# Patient Record
Sex: Female | Born: 1965 | Race: White | Hispanic: No | Marital: Married | Smoking: Never smoker
Health system: Southern US, Community
[De-identification: ages and names within clinical notes are randomized; demographics above are authoritative.]

## PROBLEM LIST (undated history)

## (undated) DIAGNOSIS — S0300XA Dislocation of jaw, unspecified side, initial encounter: Secondary | ICD-10-CM

## (undated) DIAGNOSIS — J309 Allergic rhinitis, unspecified: Secondary | ICD-10-CM

## (undated) DIAGNOSIS — E559 Vitamin D deficiency, unspecified: Secondary | ICD-10-CM

## (undated) DIAGNOSIS — I10 Essential (primary) hypertension: Secondary | ICD-10-CM

## (undated) HISTORY — DX: Essential (primary) hypertension: I10

## (undated) HISTORY — PX: NO PAST SURGERIES: SHX2092

## (undated) HISTORY — DX: Vitamin D deficiency, unspecified: E55.9

## (undated) HISTORY — PX: COLONOSCOPY: SHX174

## (undated) HISTORY — DX: Allergic rhinitis, unspecified: J30.9

## (undated) HISTORY — DX: Dislocation of jaw, unspecified side, initial encounter: S03.00XA

---

## 2015-01-25 ENCOUNTER — Encounter: Payer: Self-pay | Admitting: Emergency Medicine

## 2015-01-25 ENCOUNTER — Ambulatory Visit
Admission: EM | Admit: 2015-01-25 | Discharge: 2015-01-25 | Disposition: A | Discharge status: Home / Self Care | Payer: BLUE CROSS/BLUE SHIELD | Attending: Family Medicine | Admitting: Family Medicine

## 2015-01-25 DIAGNOSIS — M2669 Other specified disorders of temporomandibular joint: Secondary | ICD-10-CM

## 2015-01-25 MED ORDER — MELOXICAM 15 MG PO TABS: 15.0000 mg | ORAL_TABLET | Freq: Every day | ORAL | Status: DC

## 2015-01-25 NOTE — Discharge Instructions (Signed)

## 2015-01-25 NOTE — ED Provider Notes (Signed)
CSN: [id number]     Arrival date & time 01/25/15  0807 History   First MD Initiated Contact with Patient 01/25/15 0830     Chief Complaint  Patient presents with  . Otalgia   (Consider location/radiation/quality/duration/timing/severity/associated sxs/prior Treatment) Patient is a 49 y.o. female presenting with ear pain. The history is provided by the patient.  Otalgia Location:  Right Behind ear:  No abnormality Quality:  Aching and sharp Severity:  Moderate Duration:  7 days Timing:  Constant Progression:  Worsening Chronicity:  New Context: not direct blow, not elevation change, not foreign body in ear and not loud noise   Relieved by:  Nothing Ineffective treatments:  OTC medications Associated symptoms: no congestion, no ear discharge, no fever, no hearing loss, no rhinorrhea and no sore throat   Risk factors: no recent travel and no chronic ear infection     History reviewed. No pertinent past medical history. History reviewed. No pertinent past surgical history. History reviewed. No pertinent family history. Social History  Substance Use Topics  . Smoking status: Never Smoker   . Smokeless tobacco: None  . Alcohol Use: Yes   OB History    No data available     Review of Systems  Constitutional: Negative for fever.  HENT: Positive for ear pain. Negative for congestion, ear discharge, facial swelling, hearing loss, rhinorrhea and sore throat.   All other systems reviewed and are negative.   Allergies  Review of patient's allergies indicates no known allergies.  Home Medications   Prior to Admission medications   Medication Sig Start Date End Date Taking? Authorizing Provider  labetalol (NORMODYNE) 100 MG tablet Take 100 mg by mouth 2 (two) times daily.   Yes Historical Provider, MD  loratadine (CLARITIN) 10 MG tablet Take 10 mg by mouth daily.   Yes Historical Provider, MD  meloxicam (MOBIC) 15 MG tablet Take 1 tablet (15 mg total) by mouth daily. 01/25/15    Frederich Cha, MD   Meds Ordered and Administered this Visit  Medications - No data to display  BP 120/73 mmHg  Pulse 74  Temp(Src) 97.6 F (36.4 C) (Tympanic)  Resp 20  Ht 4\' 11"  (1.499 m)  Wt 121 lb (54.885 kg)  BMI 24.43 kg/m2  SpO2 99%  LMP 11/28/2014 No data found.   Physical Exam  Constitutional: She is oriented to person, place, and time. She appears well-developed and well-nourished.  HENT:  Head: Normocephalic and atraumatic.    Right Ear: There is tenderness. No swelling. No foreign bodies. Tympanic membrane is not injected, not scarred, not erythematous and not bulging. Tympanic membrane mobility is normal. No middle ear effusion.  Nose: Nose normal. No mucosal edema. Right sinus exhibits no maxillary sinus tenderness and no frontal sinus tenderness. Left sinus exhibits no maxillary sinus tenderness and no frontal sinus tenderness.    Mouth/Throat: She does not have dentures. Normal dentition.  Numbness over the right TMJ. No signs of oral dentition is in good state of repair.  Eyes: Pupils are equal, round, and reactive to light.  Neck: Normal range of motion. Neck supple. No thyromegaly present.  Musculoskeletal: Normal range of motion. She exhibits no edema or tenderness.  Neurological: She is alert and oriented to person, place, and time.  Skin: Skin is warm and dry.  Psychiatric: She has a normal mood and affect. Her behavior is normal.  Vitals reviewed.   ED Course  Procedures (including critical care time)  Labs Review Labs Reviewed - No data  to display  Imaging Review No results found.   Visual Acuity Review  Right Eye Distance:   Left Eye Distance:   Bilateral Distance:    Right Eye Near:   Left Eye Near:    Bilateral Near:         MDM   1. TMJ inflammation    Placed on Mobic 15 mg 1 tablet a day recommend mouth guard #12 weeks please see dentist/oral surgeon or PCP.    Frederich Cha, MD 01/25/15 [phone]

## 2015-01-25 NOTE — ED Notes (Signed)
Pt with right ear pain and jaw pain

## 2015-10-02 ENCOUNTER — Encounter: Payer: Self-pay | Admitting: Obstetrics and Gynecology

## 2015-10-09 ENCOUNTER — Encounter: Payer: Self-pay | Admitting: Obstetrics and Gynecology

## 2015-10-09 ENCOUNTER — Ambulatory Visit (INDEPENDENT_AMBULATORY_CARE_PROVIDER_SITE_OTHER): Payer: BLUE CROSS/BLUE SHIELD | Admitting: Obstetrics and Gynecology

## 2015-10-09 VITALS — BP 127/80 | HR 71 | Ht <= 58 in | Wt 119.6 lb

## 2015-10-09 DIAGNOSIS — Z1211 Encounter for screening for malignant neoplasm of colon: Secondary | ICD-10-CM

## 2015-10-09 DIAGNOSIS — Z01419 Encounter for gynecological examination (general) (routine) without abnormal findings: Secondary | ICD-10-CM

## 2015-10-09 DIAGNOSIS — I10 Essential (primary) hypertension: Secondary | ICD-10-CM | POA: Diagnosis not present

## 2015-10-09 DIAGNOSIS — N951 Menopausal and female climacteric states: Secondary | ICD-10-CM

## 2015-10-09 DIAGNOSIS — Z8262 Family history of osteoporosis: Secondary | ICD-10-CM | POA: Diagnosis not present

## 2015-10-09 DIAGNOSIS — Z1239 Encounter for other screening for malignant neoplasm of breast: Secondary | ICD-10-CM

## 2015-10-09 DIAGNOSIS — Z8639 Personal history of other endocrine, nutritional and metabolic disease: Secondary | ICD-10-CM

## 2015-10-09 MED ORDER — LABETALOL HCL 100 MG PO TABS: 100.0000 mg | ORAL_TABLET | Freq: Once | ORAL | Status: DC

## 2015-10-09 NOTE — Patient Instructions (Addendum)
1.No Pap smear is needed Until 2019 2. Mammogram is ordered 3.Continue with healthy eating and exercise 4. Calcium 1200 mg a day and vitamin D 1000 units a day is recommended for osteoporosis prevention 5.Stool guaiac cards are given for colon cancer screening 6.DEXA scan is ordered for family history of osteoporosis. 7. Return in 1 year for annual exam 8. Labetalol 100 mg a day is ordered

## 2015-10-09 NOTE — Progress Notes (Signed)
Patient ID: Debra Haynes, female   DOB: 02/09/1966, 50 y.o.   MRN: [id number] ANNUAL PREVENTATIVE CARE GYN  ENCOUNTER NOTE  Subjective:       Debra Haynes is a 50 y.o. G41P2002 female here for a routine annual gynecologic exam.  Current complaints: 1.  Hypertension-recently moved to the area and wishes to establish care and receive a refill of her Labetalol 2. History of Vitamin D deficiency- has had 2 years of low vitamin D levels and currently takes supplement but does not recall how many units 3. States there is a family history of osteoporosis on the maternal side and is concerned about developing it   4. Perimenopause- last menses was 1/17 that she can recall; having mild night sweats and hot flashes; does not wish to begin HRT at this time   Gynecologic History Patient's last menstrual period was 04/20/2015. Contraception: sponge Last Pap: 2016. Results were: normal Last mammogram: 2016. Results were: normal  Obstetric History OB History  Gravida Para Term Preterm AB SAB TAB Ectopic Multiple Living  2 2 2       2     # Outcome Date GA Lbr Len/2nd Weight Sex Delivery Anes PTL Lv  2 Term 1997   8 lb 8 oz (3.856 kg) M Vag-Spont   Y  1 Term 1994   7 lb 9.6 oz (3.447 kg) M Vag-Spont   Y      Past Medical History  Diagnosis Date  . TMJ (dislocation of temporomandibular joint)   . Hypertension   . Vitamin D deficiency   . AR (allergic rhinitis)     Past Surgical History  Procedure Laterality Date  . No past surgeries      Current Outpatient Prescriptions on File Prior to Visit  Medication Sig Dispense Refill  . labetalol (NORMODYNE) 100 MG tablet Take 100 mg by mouth once.     . loratadine (CLARITIN) 10 MG tablet Take 10 mg by mouth daily.     No current facility-administered medications on file prior to visit.    Allergies  Allergen Reactions  . Yaz [Drospirenone-Ethinyl Estradiol] Rash    Social History   Social History  . Marital Status: Married    Spouse Name:  N/A  . Number of Children: N/A  . Years of Education: N/A   Occupational History  . Not on file.   Social History Main Topics  . Smoking status: Never Smoker   . Smokeless tobacco: Not on file  . Alcohol Use: Yes     Comment: 5 week  . Drug Use: No  . Sexual Activity: Yes    Birth Control/ Protection: Sponge   Other Topics Concern  . Not on file   Social History Narrative    Family History  Problem Relation Age of Onset  . Diabetes Father   . Breast cancer Maternal Aunt   . Colon cancer Maternal Grandfather   . Ovarian cancer Neg Hx     The following portions of the patient's history were reviewed and updated as appropriate: allergies, current medications, past family history, past medical history, past social history, past surgical history and problem list.  Review of Systems ROS Review of Systems - General ROS: negative for - chills, fatigue, fever, hot flashes, night sweats, weight gain or weight loss Psychological ROS: negative for - anxiety, decreased libido, depression, mood swings, physical abuse or sexual abuse Ophthalmic ROS: negative for - blurry vision, eye pain or loss of vision ENT ROS: negative for -  headaches, hearing change, visual changes or vocal changes Allergy and Immunology ROS: negative for - hives, itchy/watery eyes or seasonal allergies Hematological and Lymphatic ROS: negative for - bleeding problems, bruising, swollen lymph nodes or weight loss Endocrine ROS: negative for - galactorrhea, hair pattern changes, hot flashes, malaise/lethargy, mood swings, palpitations, polydipsia/polyuria, skin changes, temperature intolerance or unexpected weight changes Breast ROS: negative for - new or changing breast lumps or nipple discharge Respiratory ROS: negative for - cough or shortness of breath Cardiovascular ROS: negative for - chest pain, irregular heartbeat, palpitations or shortness of breath Gastrointestinal ROS: no abdominal pain, change in bowel  habits, or black or bloody stools Genito-Urinary ROS: no dysuria, trouble voiding, or hematuria Musculoskeletal ROS: negative for - joint pain or joint stiffness Neurological ROS: negative for - bowel and bladder control changes Dermatological ROS: negative for rash and skin lesion changes   Objective:   BP 127/80 mmHg  Pulse 71  Ht 4\' 10"  (1.473 m)  Wt 119 lb 9.6 oz (54.25 kg)  BMI 25.00 kg/m2  LMP 04/20/2015 CONSTITUTIONAL: Well-developed, well-nourished female in no acute distress.  PSYCHIATRIC: Normal mood and affect. Normal behavior. Normal judgment and thought content. NEUROLGIC: Alert and oriented to person, place, and time. Normal muscle tone coordination. No cranial nerve deficit noted. HENT:  Normocephalic, atraumatic, External right and left ear normal. Oropharynx is clear and moist EYES: Conjunctivae and EOM are normal. Pupils are equal, round, and reactive to light. No scleral icterus.  NECK: Normal range of motion, supple, no masses.  Normal thyroid.  SKIN: Skin is warm and dry. No rash noted. Not diaphoretic. No erythema. No pallor. CARDIOVASCULAR: Normal heart rate noted, regular rhythm, no murmur. RESPIRATORY: Clear to auscultation bilaterally. Effort and breath sounds normal, no problems with respiration noted. BREASTS: Symmetric in size. No masses, skin changes, nipple drainage, or lymphadenopathy. ABDOMEN: Soft, normal bowel sounds, no distention noted.  No tenderness, rebound or guarding.  BLADDER: Normal PELVIC:  External Genitalia: Normal  BUS: Normal  Vagina: Normal  Cervix: Normal, midplane, mobile, nontender  Uterus: Normal  Adnexa: Normal, nonpalpable, nontender  RV: External Exam NormaI, No Rectal Masses and Normal Sphincter tone  MUSCULOSKELETAL: Normal range of motion. No tenderness.  No cyanosis, clubbing, or edema.  2+ distal pulses. LYMPHATIC: No Axillary, Supraclavicular, or Inguinal Adenopathy.    Assessment:   Annual gynecologic examination  50 y.o. Contraception: sponge Normal BMI Problem List Items Addressed This Visit    None    Visit Diagnoses    Well woman exam with routine gynecological exam    -  Primary    Relevant Orders    Glucose, random    Hemoglobin A1c    Lipid panel    TSH    Screening for breast cancer        Relevant Orders    MM DIGITAL SCREENING BILATERAL    Screening for colon cancer        Relevant Orders    Fecal occult blood, imunochemical    H/O vitamin D deficiency        Relevant Orders    VITAMIN D 25 Hydroxy (Vit-D Deficiency, Fractures)       Plan:  Pap: Pap Co Test Mammogram: Ordered Stool Guaiac Testing:  Ordered Labs: lipid vit d tsh a1c fbs Routine preventative health maintenance measures emphasized: Exercise/Diet/Weight control, Tobacco Warnings, Alcohol/Substance use risks and Stress Management 1. DEXA scan due to family history of osteoporosis  2. Lab work 3. Refill Labetalol 100mg  daily 4.  Order mammogram 5. Return to Clinic - 1 Year  Mable Fill, PA-S Darol Destine, CMA  Herold Harms, MD   I have seen, interviewed, and examined the patient in conjunction with the [facility].A. student and affirm the diagnosis and management plan. Sarika Baldini A. Jahdiel Krol, MD, FACOG   Note: This dictation was prepared with Dragon dictation along with smaller phrase technology. Any transcriptional errors that result from this process are unintentional.

## 2015-10-10 ENCOUNTER — Telehealth: Payer: Self-pay | Admitting: Obstetrics and Gynecology

## 2015-10-10 MED ORDER — FLUTICASONE PROPIONATE 50 MCG/ACT NA SUSP: 1.0000 | Freq: Every day | NASAL | Status: DC

## 2015-10-10 NOTE — Telephone Encounter (Signed)
flonase was not sent to pharmacy. The other RX was there but not he flonase

## 2015-10-10 NOTE — Telephone Encounter (Signed)
Pt aware flonase erx.

## 2016-04-29 DIAGNOSIS — R509 Fever, unspecified: Secondary | ICD-10-CM | POA: Diagnosis not present

## 2016-04-29 DIAGNOSIS — J111 Influenza due to unidentified influenza virus with other respiratory manifestations: Secondary | ICD-10-CM | POA: Diagnosis not present

## 2016-10-11 NOTE — Progress Notes (Signed)
Patient ID: Debra Haynes, female   DOB: Jun 15, 1965, 51 y.o.   MRN: [id number] ANNUAL PREVENTATIVE CARE GYN  ENCOUNTER NOTE  Subjective:       Debra Haynes is a 51 y.o. G29P2002 female here for a routine annual gynecologic exam.  Current complaints:  1. Weight gain; states she drinks too much and hasn't exercised since moving here. "used to walk dogs twice daily but now they have a yard"  2. Perimenopause; vasomotor symptoms worse last year, limited symptoms now. 3. Hypertension; controlled with Labetalol  4. Vitamin D deficiency per last OB/GYN in [place], [place] 5. Family history of Osteoporosis  Last eye exam: 2 weeks Last dentist visit: 1 month   She has had 5 colonoscopies starting at 23 due to rectal bleeding with constipation and diarrhea. Last one recommended she change to every 10 years. States she has been improving her diet, excersie the last few weeks and reduced alcohol intake.   Gynecologic History Patient's last menstrual period was 08/26/2016 (exact date). Contraception: sponge Last Pap: 2016. Results were: normal Last mammogram: 2016. Results were: normal  Obstetric History OB History  Gravida Para Term Preterm AB Living  2 2 2     2   SAB TAB Ectopic Multiple Live Births          2    # Outcome Date GA Lbr Len/2nd Weight Sex Delivery Anes PTL Lv  2 Term 1997   8 lb 8 oz (3.856 kg) M Vag-Spont   LIV  1 Term 1994   7 lb 9.6 oz (3.447 kg) M Vag-Spont   LIV      Past Medical History:  Diagnosis Date  . AR (allergic rhinitis)   . Hypertension   . TMJ (dislocation of temporomandibular joint)   . Vitamin D deficiency    Past Surgical History:  Procedure Laterality Date  . NO PAST SURGERIES     Current Outpatient Prescriptions on File Prior to Visit  Medication Sig Dispense Refill  . cholecalciferol (VITAMIN D) 1000 units tablet Take 1,000 Units by mouth daily.    Marland Kitchen loratadine (CLARITIN) 10 MG tablet Take 10 mg by mouth daily.     No current  facility-administered medications on file prior to visit.    Allergies  Allergen Reactions  . Azithromycin   . Yaz [Drospirenone-Ethinyl Estradiol] Rash    Social History   Social History  . Marital status: Married    Spouse name: N/A  . Number of children: 2  . Years of education: N/A   Occupational History  . Not on file.   Social History Main Topics  . Smoking status: Never Smoker  . Smokeless tobacco: Never Used  . Alcohol use Yes     Comment: 5 week  . Drug use: No  . Sexual activity: Yes    Birth control/ protection: Sponge   Other Topics Concern  . Not on file   Social History Narrative   Recently moved here from [place], [place] (2017)   Husband is an Pensions consultant   2 Adult Children    Family History  Problem Relation Age of Onset  . Diabetes Father   . Hypertension Father   . Breast cancer Maternal Aunt   . Colon cancer Maternal Grandfather   . Hypertension Mother   . Hyperthyroidism Mother        Ablation   . Ovarian cancer Neg Hx    The following portions of the patient's history were reviewed and updated as appropriate: allergies, current  medications, past family history, past medical history, past social history, past surgical history and problem list.  Review of Systems Review of Systems  Constitutional: Negative for chills, diaphoresis, fever, malaise/fatigue and weight loss.  HENT: Negative for ear pain and hearing loss.   Eyes: Negative for blurred vision and double vision.  Respiratory: Negative for cough, shortness of breath and wheezing.   Cardiovascular: Negative for chest pain and palpitations.  Gastrointestinal: Negative for abdominal pain, blood in stool, constipation, diarrhea, nausea and vomiting.  Genitourinary: Negative for dysuria, frequency and urgency.       Endorses occasional mild stress incontinence   Musculoskeletal: Negative.   Skin: Negative for itching and rash.  Neurological: Negative for dizziness, speech change, focal  weakness, weakness and headaches.  Psychiatric/Behavioral: Negative.    Objective:  BP 124/81   Pulse 72   Ht 4\' 10"  (1.473 m)   Wt 133 lb 8 oz (60.6 kg)   LMP 08/26/2016 (Exact Date)   BMI 27.90 kg/m  CONSTITUTIONAL: Well-developed, well-nourished female in no acute distress.  PSYCHIATRIC: Normal mood and affect. Normal behavior. Normal judgment and thought content. NEUROLGIC: Alert and oriented to person, place, and time. Normal muscle tone coordination. No cranial nerve deficit noted. HENT:  Normocephalic, atraumatic, External right and left ear normal. Oropharynx is clear and moist EYES: Conjunctivae and EOM are normal. Pupils are equal, round, and reactive to light. No scleral icterus.  NECK: Normal range of motion, supple, no masses.  Normal thyroid.  SKIN: Skin is warm and dry. No rash noted. Not diaphoretic. No erythema. No pallor. CARDIOVASCULAR: Normal heart rate noted, regular rhythm, no murmur. RESPIRATORY: Clear to auscultation bilaterally. Effort and breath sounds normal, no problems with respiration noted. BREASTS: Symmetric in size. No masses, skin changes, nipple drainage, or lymphadenopathy. ABDOMEN: Soft, normal bowel sounds, no distention noted.  No tenderness, rebound or guarding.  BLADDER: Normal PELVIC:  External Genitalia: Normal  BUS: Normal  Vagina: Normal  Cervix: Normal, midplane, mobile, nontender  Uterus: Normal  Adnexa: Normal, nonpalpable, nontender  RV: External Exam NormaI, No Rectal Masses and Normal Sphincter tone  MUSCULOSKELETAL: Normal range of motion. No tenderness.  No cyanosis, clubbing, or edema.  2+ distal pulses. LYMPHATIC: No Axillary, Supraclavicular, or Inguinal Adenopathy.  Assessment:  Annual gynecologic examination 51 y.o. Contraception: sponge Normal BMI 27 Problem List Items Addressed This Visit      Cardiovascular and Mediastinum   Chronic hypertension   Relevant Medications   labetalol (NORMODYNE) 100 MG tablet   Other  Relevant Orders   Hepatic function panel     Other   Family history of osteoporosis   H/O vitamin D deficiency   Relevant Orders   VITAMIN D 25 Hydroxy (Vit-D Deficiency, Fractures)    Other Visit Diagnoses    Well woman exam with routine gynecological exam    -  Primary   Relevant Orders   Pap IG and HPV (high risk) DNA detection   Screening for breast cancer       Relevant Orders   MM DIGITAL SCREENING BILATERAL   Screening for colon cancer          Plan:  Pap: Pap Co Test Mammogram: Ordered Stool Guaiac Testing:  Ordered Labs: lipid vit d tsh a1c fbs liver enzymes  Routine preventative health maintenance measures emphasized: Exercise/Diet/Weight control, Tobacco Warnings, Alcohol/Substance use risks and Stress Management   1. Well woman exam with routine gynecological exam; Pap IG and HPV (high risk) DNA detection 2. Screening for  breast cancer; MM DIGITAL SCREENING BILATERAL; Future 3. Screening for colon cancer; Records request from [facility] provider 4. Chronic hypertension; refill Labetalol, Hepatic function panel 5. H/O vitamin D deficiency; (VITAMIN D 25 Hydroxy (Vit-D Deficiency, Fractures) 6. Family history of osteoporosis; DEXA and  Vit D    Crystal Miller, CMA Mark Herriman, Student-PA [employer]  Herold Harms, MD    I have seen, interviewed, and examined the patient in conjunction with the [employer].A. student and affirm the diagnosis and management plan. Demetris Capell A. Lenyx Boody, MD, FACOG   Note: This dictation was prepared with Dragon dictation along with smaller phrase technology. Any transcriptional errors that result from this process are unintentional.

## 2016-10-12 ENCOUNTER — Encounter: Payer: Self-pay | Admitting: Obstetrics and Gynecology

## 2016-10-12 ENCOUNTER — Ambulatory Visit (INDEPENDENT_AMBULATORY_CARE_PROVIDER_SITE_OTHER): Payer: BLUE CROSS/BLUE SHIELD | Admitting: Obstetrics and Gynecology

## 2016-10-12 VITALS — BP 124/81 | HR 72 | Ht <= 58 in | Wt 133.5 lb

## 2016-10-12 DIAGNOSIS — Z01419 Encounter for gynecological examination (general) (routine) without abnormal findings: Secondary | ICD-10-CM | POA: Diagnosis not present

## 2016-10-12 DIAGNOSIS — Z1239 Encounter for other screening for malignant neoplasm of breast: Secondary | ICD-10-CM

## 2016-10-12 DIAGNOSIS — Z1211 Encounter for screening for malignant neoplasm of colon: Secondary | ICD-10-CM

## 2016-10-12 DIAGNOSIS — Z13820 Encounter for screening for osteoporosis: Secondary | ICD-10-CM

## 2016-10-12 DIAGNOSIS — Z8639 Personal history of other endocrine, nutritional and metabolic disease: Secondary | ICD-10-CM

## 2016-10-12 DIAGNOSIS — I10 Essential (primary) hypertension: Secondary | ICD-10-CM

## 2016-10-12 DIAGNOSIS — Z1231 Encounter for screening mammogram for malignant neoplasm of breast: Secondary | ICD-10-CM | POA: Diagnosis not present

## 2016-10-12 DIAGNOSIS — Z8262 Family history of osteoporosis: Secondary | ICD-10-CM

## 2016-10-12 MED ORDER — LABETALOL HCL 100 MG PO TABS: 100.0000 mg | ORAL_TABLET | Freq: Once | ORAL | 4 refills | Status: DC

## 2016-10-12 NOTE — Patient Instructions (Signed)
1. Pap smear is done 2. Mammogram is ordered 3. Screening labs are obtained including vitamin D level for history of low vitamin D level and hepatic profile due to alcohol use. 4. His screening for colon cancer is deferred due to recent colonoscopy 5. Recommend calcium with vitamin D supplementation 1200 mg per day/800 international units per day 6. Labetalol is refilled for 1 year 7. DEXA scan is ordered due to family history of osteoporosis as well as low vitamin D level 8. Return in 1 year   Health Maintenance for Postmenopausal Women Menopause is a normal process in which your reproductive ability comes to an end. This process happens gradually over a span of months to years, usually between the ages of 80 and 12. Menopause is complete when you have missed 12 consecutive menstrual periods. It is important to talk with your health care provider about some of the most common conditions that affect postmenopausal women, such as heart disease, cancer, and bone loss (osteoporosis). Adopting a healthy lifestyle and getting preventive care can help to promote your health and wellness. Those actions can also lower your chances of developing some of these common conditions. What should I know about menopause? During menopause, you may experience a number of symptoms, such as:  Moderate-to-severe hot flashes.  Night sweats.  Decrease in sex drive.  Mood swings.  Headaches.  Tiredness.  Irritability.  Memory problems.  Insomnia.  Choosing to treat or not to treat menopausal changes is an individual decision that you make with your health care provider. What should I know about hormone replacement therapy and supplements? Hormone therapy products are effective for treating symptoms that are associated with menopause, such as hot flashes and night sweats. Hormone replacement carries certain risks, especially as you become older. If you are thinking about using estrogen or estrogen with  progestin treatments, discuss the benefits and risks with your health care provider. What should I know about heart disease and stroke? Heart disease, heart attack, and stroke become more likely as you age. This may be due, in part, to the hormonal changes that your body experiences during menopause. These can affect how your body processes dietary fats, triglycerides, and cholesterol. Heart attack and stroke are both medical emergencies. There are many things that you can do to help prevent heart disease and stroke:  Have your blood pressure checked at least every 1-2 years. High blood pressure causes heart disease and increases the risk of stroke.  If you are 81-56 years old, ask your health care provider if you should take aspirin to prevent a heart attack or a stroke.  Do not use any tobacco products, including cigarettes, chewing tobacco, or electronic cigarettes. If you need help quitting, ask your health care provider.  It is important to eat a healthy diet and maintain a healthy weight. ? Be sure to include plenty of vegetables, fruits, low-fat dairy products, and lean protein. ? Avoid eating foods that are high in solid fats, added sugars, or salt (sodium).  Get regular exercise. This is one of the most important things that you can do for your health. ? Try to exercise for at least 150 minutes each week. The type of exercise that you do should increase your heart rate and make you sweat. This is known as moderate-intensity exercise. ? Try to do strengthening exercises at least twice each week. Do these in addition to the moderate-intensity exercise.  Know your numbers.Ask your health care provider to check your cholesterol and  your blood glucose. Continue to have your blood tested as directed by your health care provider.  What should I know about cancer screening? There are several types of cancer. Take the following steps to reduce your risk and to catch any cancer development as  early as possible. Breast Cancer  Practice breast self-awareness. ? This means understanding how your breasts normally appear and feel. ? It also means doing regular breast self-exams. Let your health care provider know about any changes, no matter how small.  If you are 84 or older, have a clinician do a breast exam (clinical breast exam or CBE) every year. Depending on your age, family history, and medical history, it may be recommended that you also have a yearly breast X-ray (mammogram).  If you have a family history of breast cancer, talk with your health care provider about genetic screening.  If you are at high risk for breast cancer, talk with your health care provider about having an MRI and a mammogram every year.  Breast cancer (BRCA) gene test is recommended for women who have family members with BRCA-related cancers. Results of the assessment will determine the need for genetic counseling and BRCA1 and for BRCA2 testing. BRCA-related cancers include these types: ? Breast. This occurs in males or females. ? Ovarian. ? Tubal. This may also be called fallopian tube cancer. ? Cancer of the abdominal or pelvic lining (peritoneal cancer). ? Prostate. ? Pancreatic.  Cervical, Uterine, and Ovarian Cancer Your health care provider may recommend that you be screened regularly for cancer of the pelvic organs. These include your ovaries, uterus, and vagina. This screening involves a pelvic exam, which includes checking for microscopic changes to the surface of your cervix (Pap test).  For women ages 21-65, health care providers may recommend a pelvic exam and a Pap test every three years. For women ages 12-65, they may recommend the Pap test and pelvic exam, combined with testing for human papilloma virus (HPV), every five years. Some types of HPV increase your risk of cervical cancer. Testing for HPV may also be done on women of any age who have unclear Pap test results.  Other health  care providers may not recommend any screening for nonpregnant women who are considered low risk for pelvic cancer and have no symptoms. Ask your health care provider if a screening pelvic exam is right for you.  If you have had past treatment for cervical cancer or a condition that could lead to cancer, you need Pap tests and screening for cancer for at least 20 years after your treatment. If Pap tests have been discontinued for you, your risk factors (such as having a new sexual partner) need to be reassessed to determine if you should start having screenings again. Some women have medical problems that increase the chance of getting cervical cancer. In these cases, your health care provider may recommend that you have screening and Pap tests more often.  If you have a family history of uterine cancer or ovarian cancer, talk with your health care provider about genetic screening.  If you have vaginal bleeding after reaching menopause, tell your health care provider.  There are currently no reliable tests available to screen for ovarian cancer.  Lung Cancer Lung cancer screening is recommended for adults 60-80 years old who are at high risk for lung cancer because of a history of smoking. A yearly low-dose CT scan of the lungs is recommended if you:  Currently smoke.  Have a history  of at least 30 pack-years of smoking and you currently smoke or have quit within the past 15 years. A pack-year is smoking an average of one pack of cigarettes per day for one year.  Yearly screening should:  Continue until it has been 15 years since you quit.  Stop if you develop a health problem that would prevent you from having lung cancer treatment.  Colorectal Cancer  This type of cancer can be detected and can often be prevented.  Routine colorectal cancer screening usually begins at age 58 and continues through age 83.  If you have risk factors for colon cancer, your health care provider may  recommend that you be screened at an earlier age.  If you have a family history of colorectal cancer, talk with your health care provider about genetic screening.  Your health care provider may also recommend using home test kits to check for hidden blood in your stool.  A small camera at the end of a tube can be used to examine your colon directly (sigmoidoscopy or colonoscopy). This is done to check for the earliest forms of colorectal cancer.  Direct examination of the colon should be repeated every 5-10 years until age 44. However, if early forms of precancerous polyps or small growths are found or if you have a family history or genetic risk for colorectal cancer, you may need to be screened more often.  Skin Cancer  Check your skin from head to toe regularly.  Monitor any moles. Be sure to tell your health care provider: ? About any new moles or changes in moles, especially if there is a change in a mole's shape or color. ? If you have a mole that is larger than the size of a pencil eraser.  If any of your family members has a history of skin cancer, especially at a young age, talk with your health care provider about genetic screening.  Always use sunscreen. Apply sunscreen liberally and repeatedly throughout the day.  Whenever you are outside, protect yourself by wearing long sleeves, pants, a wide-brimmed hat, and sunglasses.  What should I know about osteoporosis? Osteoporosis is a condition in which bone destruction happens more quickly than new bone creation. After menopause, you may be at an increased risk for osteoporosis. To help prevent osteoporosis or the bone fractures that can happen because of osteoporosis, the following is recommended:  If you are 5-58 years old, get at least 1,000 mg of calcium and at least 600 mg of vitamin D per day.  If you are older than age 45 but younger than age 73, get at least 1,200 mg of calcium and at least 600 mg of vitamin D per  day.  If you are older than age 21, get at least 1,200 mg of calcium and at least 800 mg of vitamin D per day.  Smoking and excessive alcohol intake increase the risk of osteoporosis. Eat foods that are rich in calcium and vitamin D, and do weight-bearing exercises several times each week as directed by your health care provider. What should I know about how menopause affects my mental health? Depression may occur at any age, but it is more common as you become older. Common symptoms of depression include:  Low or sad mood.  Changes in sleep patterns.  Changes in appetite or eating patterns.  Feeling an overall lack of motivation or enjoyment of activities that you previously enjoyed.  Frequent crying spells.  Talk with your health care provider  if you think that you are experiencing depression. What should I know about immunizations? It is important that you get and maintain your immunizations. These include:  Tetanus, diphtheria, and pertussis (Tdap) booster vaccine.  Influenza every year before the flu season begins.  Pneumonia vaccine.  Shingles vaccine.  Your health care provider may also recommend other immunizations. This information is not intended to replace advice given to you by your health care provider. Make sure you discuss any questions you have with your health care provider. Document Released: 05/28/2005 Document Revised: 10/24/2015 Document Reviewed: 01/07/2015 Elsevier Interactive Patient Education  2018 [employer]  

## 2016-10-14 ENCOUNTER — Other Ambulatory Visit: Payer: BLUE CROSS/BLUE SHIELD

## 2016-10-14 DIAGNOSIS — Z8639 Personal history of other endocrine, nutritional and metabolic disease: Secondary | ICD-10-CM | POA: Diagnosis not present

## 2016-10-14 DIAGNOSIS — I10 Essential (primary) hypertension: Secondary | ICD-10-CM | POA: Diagnosis not present

## 2016-10-15 LAB — PAP IG AND HPV HIGH-RISK
HPV, high-risk: NEGATIVE
PAP Smear Comment: 0

## 2016-10-15 LAB — HEPATIC FUNCTION PANEL
ALT: 21 IU/L (ref 0–32)
AST: 23 IU/L (ref 0–40)
Albumin: 4.8 g/dL (ref 3.5–5.5)
Alkaline Phosphatase: 66 IU/L (ref 39–117)
Bilirubin Total: 0.5 mg/dL (ref 0.0–1.2)
Bilirubin, Direct: 0.17 mg/dL (ref 0.00–0.40)
Total Protein: 7 g/dL (ref 6.0–8.5)

## 2016-10-15 LAB — VITAMIN D 25 HYDROXY (VIT D DEFICIENCY, FRACTURES): Vit D, 25-Hydroxy: 32.4 ng/mL (ref 30.0–100.0)

## 2016-10-26 ENCOUNTER — Ambulatory Visit
Admission: RE | Admit: 2016-10-26 | Discharge: 2016-10-26 | Disposition: A | Discharge status: Home / Self Care | Payer: BLUE CROSS/BLUE SHIELD | Source: Ambulatory Visit | Attending: Obstetrics and Gynecology | Admitting: Obstetrics and Gynecology

## 2016-10-26 DIAGNOSIS — Z1231 Encounter for screening mammogram for malignant neoplasm of breast: Secondary | ICD-10-CM | POA: Diagnosis not present

## 2016-10-26 DIAGNOSIS — M85851 Other specified disorders of bone density and structure, right thigh: Secondary | ICD-10-CM | POA: Insufficient documentation

## 2016-10-26 DIAGNOSIS — Z8262 Family history of osteoporosis: Secondary | ICD-10-CM | POA: Insufficient documentation

## 2016-10-26 DIAGNOSIS — Z13820 Encounter for screening for osteoporosis: Secondary | ICD-10-CM

## 2016-10-26 DIAGNOSIS — Z1239 Encounter for other screening for malignant neoplasm of breast: Secondary | ICD-10-CM

## 2016-10-29 ENCOUNTER — Other Ambulatory Visit: Payer: Self-pay | Admitting: Obstetrics and Gynecology

## 2016-11-01 ENCOUNTER — Inpatient Hospital Stay
Admission: RE | Admit: 2016-11-01 | Discharge: 2016-11-01 | Disposition: A | Discharge status: Home / Self Care | Payer: Self-pay | Source: Ambulatory Visit | Attending: *Deleted | Admitting: *Deleted

## 2016-11-01 ENCOUNTER — Other Ambulatory Visit: Payer: Self-pay | Admitting: *Deleted

## 2016-11-01 DIAGNOSIS — Z9289 Personal history of other medical treatment: Secondary | ICD-10-CM

## 2016-12-15 ENCOUNTER — Telehealth: Payer: Self-pay | Admitting: Obstetrics and Gynecology

## 2016-12-15 NOTE — Telephone Encounter (Signed)
Pt stated that her insurance wasn't covered because it wasn't ordered as a preventative test and that [employer] advised pt that if Dr. Keturah Barre would amend the order, the test would be covered. Please advise. Thanks TNP

## 2016-12-16 NOTE — Telephone Encounter (Signed)
lmtrc

## 2016-12-17 NOTE — Telephone Encounter (Signed)
Pt states her bone density needs to be coded as preventive for her insurance to pay. Coded as FH of osteoporosis. Spoke with Roselyn Reef at [place] she is going to let me know who to contact to change dx code. Pt aware if may take a week to get back with her.

## 2016-12-23 NOTE — Addendum Note (Signed)
Addended by: Elouise Munroe on: 12/23/2016 04:50 PM   Modules accepted: Orders

## 2016-12-28 NOTE — Telephone Encounter (Signed)
Pt aware I have submitted a new order with correct dx code. I am awaiting to hear from [place].

## 2017-01-03 ENCOUNTER — Other Ambulatory Visit: Payer: Self-pay | Admitting: Obstetrics and Gynecology

## 2017-01-11 NOTE — Telephone Encounter (Signed)
Pt states she has pd her bone density bill. She will reach out to [facility] in a few weeks to see if the new dx code was billed.

## 2017-01-14 ENCOUNTER — Encounter: Payer: Self-pay | Admitting: Obstetrics and Gynecology

## 2017-01-14 ENCOUNTER — Ambulatory Visit (INDEPENDENT_AMBULATORY_CARE_PROVIDER_SITE_OTHER): Payer: BLUE CROSS/BLUE SHIELD | Admitting: Obstetrics and Gynecology

## 2017-01-14 VITALS — BP 128/86 | HR 82 | Ht <= 58 in | Wt 134.5 lb

## 2017-01-14 DIAGNOSIS — Z23 Encounter for immunization: Secondary | ICD-10-CM

## 2017-01-14 NOTE — Patient Instructions (Signed)

## 2017-01-14 NOTE — Progress Notes (Signed)
  After obtaining consent, and per orders of Dr. Enzo Bi injection of Influenza given by Joyice Faster. Patient instructed to report any adverse reaction immediately.  I have reviewed the record and concur with patient management and plan. DEFRANCESCO, Hassell Done, MD, Cherlynn June

## 2017-05-28 ENCOUNTER — Other Ambulatory Visit: Payer: Self-pay | Admitting: Obstetrics and Gynecology

## 2017-09-18 ENCOUNTER — Other Ambulatory Visit: Payer: Self-pay | Admitting: Obstetrics and Gynecology

## 2017-10-23 ENCOUNTER — Other Ambulatory Visit: Payer: Self-pay | Admitting: Obstetrics and Gynecology

## 2017-10-24 NOTE — Progress Notes (Deleted)
Patient ID: Debra Haynes, female   DOB: 05-06-1965, 52 y.o.   MRN: [id number] ANNUAL PREVENTATIVE CARE GYN  ENCOUNTER NOTE  Subjective:       Debra Haynes is a 52 y.o. G32P2002 female here for a routine annual gynecologic exam.  Current complaints:  1.   She has had 5 colonoscopies starting at 23 due to rectal bleeding with constipation and diarrhea. Last one recommended she change to every 10 years. States she has been improving her diet, excersie the last few weeks and reduced alcohol intake.   Gynecologic History No LMP recorded. (Menstrual status: Perimenopausal). Contraception: sponge Last Pap: 10/12/2016 neg/neg . Results were: normal Last mammogram: 11/02/2016 birad 1. Results were: normal  Obstetric History OB History  Gravida Para Term Preterm AB Living  2 2 2     2   SAB TAB Ectopic Multiple Live Births          2    # Outcome Date GA Lbr Len/2nd Weight Sex Delivery Anes PTL Lv  2 Term 1997   8 lb 8 oz (3.856 kg) M Vag-Spont   LIV  1 Term 1994   7 lb 9.6 oz (3.447 kg) M Vag-Spont   LIV    Past Medical History:  Diagnosis Date  . AR (allergic rhinitis)   . Hypertension   . TMJ (dislocation of temporomandibular joint)   . Vitamin D deficiency    Past Surgical History:  Procedure Laterality Date  . NO PAST SURGERIES     Current Outpatient Medications on File Prior to Visit  Medication Sig Dispense Refill  . cholecalciferol (VITAMIN D) 1000 units tablet Take 1,000 Units by mouth daily.    . fluticasone (FLONASE) 50 MCG/ACT nasal spray SHAKE LIQUID AND USE 1 SPRAY IN EACH NOSTRIL DAILY 16 g 2  . labetalol (NORMODYNE) 100 MG tablet TAKE 1 TABLET(100 MG) BY MOUTH 1 TIME 90 tablet 0  . labetalol (NORMODYNE) 100 MG tablet TAKE 1 TABLET(100 MG) BY MOUTH 1 TIME 90 tablet 0  . loratadine (CLARITIN) 10 MG tablet Take 10 mg by mouth daily.     No current facility-administered medications on file prior to visit.    Allergies  Allergen Reactions  . Azithromycin   . Yaz  [Drospirenone-Ethinyl Estradiol] Rash    Social History   Socioeconomic History  . Marital status: Married    Spouse name: Not on file  . Number of children: 2  . Years of education: Not on file  . Highest education level: Not on file  Occupational History  . Not on file  Social Needs  . Financial resource strain: Not on file  . Food insecurity:    Worry: Not on file    Inability: Not on file  . Transportation needs:    Medical: Not on file    Non-medical: Not on file  Tobacco Use  . Smoking status: Never Smoker  . Smokeless tobacco: Never Used  Substance and Sexual Activity  . Alcohol use: Yes    Comment: 5 week  . Drug use: No  . Sexual activity: Yes    Birth control/protection: Sponge  Lifestyle  . Physical activity:    Days per week: Not on file    Minutes per session: Not on file  . Stress: Not on file  Relationships  . Social connections:    Talks on phone: Not on file    Gets together: Not on file    Attends religious service: Not on file  Active member of club or organization: Not on file    Attends meetings of clubs or organizations: Not on file    Relationship status: Not on file  . Intimate partner violence:    Fear of current or ex partner: Not on file    Emotionally abused: Not on file    Physically abused: Not on file    Forced sexual activity: Not on file  Other Topics Concern  . Not on file  Social History Narrative   Recently moved here from [place], [place] (2017)   Husband is an Pensions consultant   2 Adult Children    Family History  Problem Relation Age of Onset  . Diabetes Father   . Hypertension Father   . Breast cancer Maternal Aunt   . Colon cancer Maternal Grandfather   . Hypertension Mother   . Hyperthyroidism Mother        Ablation   . Breast cancer Cousin 50       mat cousin  . Ovarian cancer Neg Hx    The following portions of the patient's history were reviewed and updated as appropriate: allergies, current medications, past  family history, past medical history, past social history, past surgical history and problem list.  Review of Systems  Objective:  There were no vitals taken for this visit. CONSTITUTIONAL: Well-developed, well-nourished female in no acute distress.  PSYCHIATRIC: Normal mood and affect. Normal behavior. Normal judgment and thought content. NEUROLGIC: Alert and oriented to person, place, and time. Normal muscle tone coordination. No cranial nerve deficit noted. HENT:  Normocephalic, atraumatic, External right and left ear normal. Oropharynx is clear and moist EYES: Conjunctivae and EOM are normal. Pupils are equal, round, and reactive to light. No scleral icterus.  NECK: Normal range of motion, supple, no masses.  Normal thyroid.  SKIN: Skin is warm and dry. No rash noted. Not diaphoretic. No erythema. No pallor. CARDIOVASCULAR: Normal heart rate noted, regular rhythm, no murmur. RESPIRATORY: Clear to auscultation bilaterally. Effort and breath sounds normal, no problems with respiration noted. BREASTS: Symmetric in size. No masses, skin changes, nipple drainage, or lymphadenopathy. ABDOMEN: Soft, normal bowel sounds, no distention noted.  No tenderness, rebound or guarding.  BLADDER: Normal PELVIC:  External Genitalia: Normal  BUS: Normal  Vagina: Normal  Cervix: Normal, midplane, mobile, nontender  Uterus: Normal  Adnexa: Normal, nonpalpable, nontender  RV: External Exam NormaI, No Rectal Masses and Normal Sphincter tone  MUSCULOSKELETAL: Normal range of motion. No tenderness.  No cyanosis, clubbing, or edema.  2+ distal pulses. LYMPHATIC: No Axillary, Supraclavicular, or Inguinal Adenopathy.  Assessment:  Annual gynecologic examination 52 y.o. Contraception: sponge Normal BMI 27 Problem List Items Addressed This Visit    Family history of osteoporosis   H/O vitamin D deficiency   Chronic hypertension    Other Visit Diagnoses    Well woman exam with routine gynecological exam     -  Primary   Screening for breast cancer       Screening for colon cancer          Plan:  Pap:Due 2021 Mammogram: Ordered Stool Guaiac Testing:  Ordered Labs: lipid vit d tsh a1c fbs liver enzymes  Routine preventative health maintenance measures emphasized: Exercise/Diet/Weight control, Tobacco Warnings, Alcohol/Substance use risks and Stress Management    Debra Haynes, CMA    I have seen, interviewed, and examined the patient in conjunction with the [facility] student and affirm the diagnosis and management plan. Martin A. DeFrancesco, MD, [facility]n Core  Note: This dictation was prepared with Dragon dictation along with smaller phrase technology. Any transcriptional errors that result from this process are unintentional.

## 2017-10-26 ENCOUNTER — Encounter: Payer: BLUE CROSS/BLUE SHIELD | Admitting: Obstetrics and Gynecology

## 2017-11-16 NOTE — Progress Notes (Signed)
Patient ID: Debra Haynes, female   DOB: Sep 11, 1965, 52 y.o.   MRN: [id number] ANNUAL PREVENTATIVE CARE GYN  ENCOUNTER NOTE  Subjective:       Debra Haynes is a 52 y.o. G14P2002 female here for a routine annual gynecologic exam.  Current complaints:  1. NO WT LOSS DESPITE EXERCISE 4 DAYS A WEEK AND DECREASING ETOH-patient reports getting approximately 10,000 steps a day.  She is eating irregularly, 2 meals a day.  2. NIGHT SWEATS AND HOT FLASHES X 4 MONTHS WORSE-last menstrual period March 2019; previous menstrual period October 2018; symptoms are more intense over the past 6 months.  She is interested in HRT therapy.  She is not experiencing any significant vaginal dryness.  She is taking vitamin D 1000 units a day but has not been consistently taking calcium.  Patient reports having gotten a DEXA scan showing osteopenia  Gynecologic History No LMP recorded. (Menstrual status: Perimenopausal). Contraception: NONE Last Pap: 10/12/2016 neg/neg . Results were: normal Last mammogram: 11/02/2016 birad 1. Results were: normal History of multiple colonoscopies due to recurrent rectal bleeding from constipation and diarrhea; most recent recommendation is for patient to proceed with colonoscopy every 10 years. DEXA scan 2018: Osteopenia-T score -1.6 femoral neck   Obstetric History OB History  Gravida Para Term Preterm AB Living  2 2 2     2   SAB TAB Ectopic Multiple Live Births          2    # Outcome Date GA Lbr Len/2nd Weight Sex Delivery Anes PTL Lv  2 Term 1997   8 lb 8 oz (3.856 kg) M Vag-Spont   LIV  1 Term 1994   7 lb 9.6 oz (3.447 kg) M Vag-Spont   LIV    Past Medical History:  Diagnosis Date  . AR (allergic rhinitis)   . Hypertension   . TMJ (dislocation of temporomandibular joint)   . Vitamin D deficiency    Past Surgical History:  Procedure Laterality Date  . NO PAST SURGERIES     Current Outpatient Medications on File Prior to Visit  Medication Sig Dispense Refill  .  cholecalciferol (VITAMIN D) 1000 units tablet Take 1,000 Units by mouth daily.    . fluticasone (FLONASE) 50 MCG/ACT nasal spray SHAKE LIQUID AND USE 1 SPRAY IN EACH NOSTRIL DAILY 16 g 2  . labetalol (NORMODYNE) 100 MG tablet TAKE 1 TABLET(100 MG) BY MOUTH 1 TIME 90 tablet 0  . labetalol (NORMODYNE) 100 MG tablet TAKE 1 TABLET(100 MG) BY MOUTH 1 TIME 90 tablet 0  . loratadine (CLARITIN) 10 MG tablet Take 10 mg by mouth daily.     No current facility-administered medications on file prior to visit.    Allergies  Allergen Reactions  . Azithromycin   . Yaz [Drospirenone-Ethinyl Estradiol] Rash    Social History   Socioeconomic History  . Marital status: Married    Spouse name: Not on file  . Number of children: 2  . Years of education: Not on file  . Highest education level: Not on file  Occupational History  . Not on file  Social Needs  . Financial resource strain: Not on file  . Food insecurity:    Worry: Not on file    Inability: Not on file  . Transportation needs:    Medical: Not on file    Non-medical: Not on file  Tobacco Use  . Smoking status: Never Smoker  . Smokeless tobacco: Never Used  Substance and Sexual Activity  .  Alcohol use: Yes    Comment: 5 week  . Drug use: No  . Sexual activity: Yes    Birth control/protection: Sponge  Lifestyle  . Physical activity:    Days per week: Not on file    Minutes per session: Not on file  . Stress: Not on file  Relationships  . Social connections:    Talks on phone: Not on file    Gets together: Not on file    Attends religious service: Not on file    Active member of club or organization: Not on file    Attends meetings of clubs or organizations: Not on file    Relationship status: Not on file  . Intimate partner violence:    Fear of current or ex partner: Not on file    Emotionally abused: Not on file    Physically abused: Not on file    Forced sexual activity: Not on file  Other Topics Concern  . Not on file   Social History Narrative   Recently moved here from [place], [place] (2017)   Husband is an Forensic psychologist   2 Adult Children    Family History  Problem Relation Age of Onset  . Diabetes Father   . Hypertension Father   . Breast cancer Maternal Aunt   . Colon cancer Maternal Grandfather   . Hypertension Mother   . Hyperthyroidism Mother        Ablation   . Breast cancer Cousin 8       mat cousin  . Ovarian cancer Neg Hx    The following portions of the patient's history were reviewed and updated as appropriate: allergies, current medications, past family history, past medical history, past social history, past surgical history and problem list.  Review of Systems Review of Systems  Constitutional:       Heart flashes and night sweats, increasing intensity and frequency  Eyes: Negative.   Respiratory: Negative.   Cardiovascular: Negative.   Gastrointestinal: Negative.   Genitourinary: Negative.   Musculoskeletal: Negative.   Skin: Negative.   Neurological: Negative.   Endo/Heme/Allergies: Negative.   Psychiatric/Behavioral: Negative.     Objective:  BP 108/73   Pulse 77   Ht 4\' 10"  (1.473 m)   Wt 134 lb 12.8 oz (61.1 kg)   LMP 06/17/2017 (Exact Date)   BMI 28.17 kg/m  CONSTITUTIONAL: Well-developed, well-nourished female in no acute distress.  PSYCHIATRIC: Normal mood and affect. Normal behavior. Normal judgment and thought content. [place]: Alert and oriented to person, place, and time. Normal muscle tone coordination. No cranial nerve deficit noted. HENT:  Normocephalic, atraumatic, External right and left ear normal.  EYES: Conjunctivae and EOM are normal.  No scleral icterus.  NECK: Normal range of motion, supple, no masses.  Normal thyroid.  SKIN: Skin is warm and dry. No rash noted. Not diaphoretic. No erythema. No pallor. CARDIOVASCULAR: Normal heart rate noted, regular rhythm, no murmur. RESPIRATORY: Clear to auscultation bilaterally. Effort and breath sounds  normal, no problems with respiration noted. BREASTS: Symmetric in size. No masses, skin changes, nipple drainage, or lymphadenopathy. ABDOMEN: Soft, normal bowel sounds, no distention noted.  No tenderness, rebound or guarding.  BLADDER: Normal PELVIC: (Unchanged)  External Genitalia: Normal  BUS: Normal  Vagina: Normal  Cervix: Normal, midplane, mobile, nontender  Uterus: Normal  Adnexa: Normal, nonpalpable, nontender  RV: External Exam NormaI, No Rectal Masses and Normal Sphincter tone  MUSCULOSKELETAL: Normal range of motion. No tenderness.  No cyanosis, clubbing, or edema.  2+ distal pulses. LYMPHATIC: No Axillary, Supraclavicular, or Inguinal Adenopathy.  Assessment:  Annual gynecologic examination 51 y.o. Contraception: NONE Overweight 28 Problem List Items Addressed This Visit    Family history of osteoporosis   H/O vitamin D deficiency   Chronic hypertension    Other Visit Diagnoses    Well woman exam with routine gynecological exam    -  Primary   Screening for breast cancer       Screening for colon cancer        Osteopenia  Plan:  Pap:Due 2021 Mammogram: Ordered Stool Guaiac Testing:  Ordered Labs; lipid a1c fbs tsh hepatic function Routine preventative health maintenance measures emphasized: Exercise/Diet/Weight control, Tobacco Warnings, Alcohol/Substance use risks and Stress Management  Strongly encourage calcium supplementation 600 mg twice a day.  Patient may use Tums 3 times a day as a substitute.  Continue with vitamin D supplementation Present cons of HRT have been reviewed.  Patient is willing to go on a trial of bioidentical HRT. Bijuva 1/100 mg daily is prescribed Return in 6 weeks for follow-up Return in 1 year for annual exam   Joyice Faster, CMA Brayton Mars, MD  Note: This dictation was prepared with Dragon dictation along with smaller phrase technology. Any transcriptional errors that result from this process are  unintentional.

## 2017-11-30 ENCOUNTER — Encounter: Payer: Self-pay | Admitting: Obstetrics and Gynecology

## 2017-11-30 ENCOUNTER — Ambulatory Visit (INDEPENDENT_AMBULATORY_CARE_PROVIDER_SITE_OTHER): Payer: BLUE CROSS/BLUE SHIELD | Admitting: Obstetrics and Gynecology

## 2017-11-30 VITALS — BP 108/73 | HR 77 | Ht <= 58 in | Wt 134.8 lb

## 2017-11-30 DIAGNOSIS — I10 Essential (primary) hypertension: Secondary | ICD-10-CM

## 2017-11-30 DIAGNOSIS — Z780 Asymptomatic menopausal state: Secondary | ICD-10-CM | POA: Insufficient documentation

## 2017-11-30 DIAGNOSIS — Z8639 Personal history of other endocrine, nutritional and metabolic disease: Secondary | ICD-10-CM

## 2017-11-30 DIAGNOSIS — Z1239 Encounter for other screening for malignant neoplasm of breast: Secondary | ICD-10-CM

## 2017-11-30 DIAGNOSIS — N951 Menopausal and female climacteric states: Secondary | ICD-10-CM

## 2017-11-30 DIAGNOSIS — Z1231 Encounter for screening mammogram for malignant neoplasm of breast: Secondary | ICD-10-CM | POA: Diagnosis not present

## 2017-11-30 DIAGNOSIS — Z01419 Encounter for gynecological examination (general) (routine) without abnormal findings: Secondary | ICD-10-CM

## 2017-11-30 DIAGNOSIS — M85851 Other specified disorders of bone density and structure, right thigh: Secondary | ICD-10-CM

## 2017-11-30 DIAGNOSIS — Z1211 Encounter for screening for malignant neoplasm of colon: Secondary | ICD-10-CM | POA: Diagnosis not present

## 2017-11-30 DIAGNOSIS — M8580 Other specified disorders of bone density and structure, unspecified site: Secondary | ICD-10-CM | POA: Insufficient documentation

## 2017-11-30 DIAGNOSIS — Z8262 Family history of osteoporosis: Secondary | ICD-10-CM

## 2017-11-30 DIAGNOSIS — E663 Overweight: Secondary | ICD-10-CM

## 2017-11-30 MED ORDER — LABETALOL HCL 100 MG PO TABS: 100.0000 mg | ORAL_TABLET | Freq: Once | ORAL | 3 refills | Status: DC

## 2017-11-30 MED ORDER — ESTRADIOL-PROGESTERONE 1-100 MG PO CAPS: 100.0000 mg | ORAL_CAPSULE | Freq: Every day | ORAL | 6 refills | Status: DC

## 2017-11-30 NOTE — Patient Instructions (Addendum)
1.  Pap smear is not done.  Next Pap smear is due 2021. 2.  Mammogram is ordered 3.  Stool guaiac card testing is given for colon cancer screening 4.  Screening labs are ordered 5.  Continue with healthy eating and exercise with attempt at control weight loss 6.  Return in 1 year for annual exam 7.  Begin trial of by Bijuva 1 tablet daily 8.  Return in 6 weeks for follow-up  Health Maintenance, Female Adopting a healthy lifestyle and getting preventive care can go a long way to promote health and wellness. Talk with your health care provider about what schedule of regular examinations is right for you. This is a good chance for you to check in with your provider about disease prevention and staying healthy. In between checkups, there are plenty of things you can do on your own. Experts have done a lot of research about which lifestyle changes and preventive measures are most likely to keep you healthy. Ask your health care provider for more information. Weight and diet Eat a healthy diet  Be sure to include plenty of vegetables, fruits, low-fat dairy products, and lean protein.  Do not eat a lot of foods high in solid fats, added sugars, or salt.  Get regular exercise. This is one of the most important things you can do for your health. ? Most adults should exercise for at least 150 minutes each week. The exercise should increase your heart rate and make you sweat (moderate-intensity exercise). ? Most adults should also do strengthening exercises at least twice a week. This is in addition to the moderate-intensity exercise.  Maintain a healthy weight  Body mass index (BMI) is a measurement that can be used to identify possible weight problems. It estimates body fat based on height and weight. Your health care provider can help determine your BMI and help you achieve or maintain a healthy weight.  For females 11 years of age and older: ? A BMI below 18.5 is considered underweight. ? A BMI  of 18.5 to 24.9 is normal. ? A BMI of 25 to 29.9 is considered overweight. ? A BMI of 30 and above is considered obese.  Watch levels of cholesterol and blood lipids  You should start having your blood tested for lipids and cholesterol at 52 years of age, then have this test every 5 years.  You may need to have your cholesterol levels checked more often if: ? Your lipid or cholesterol levels are high. ? You are older than 52 years of age. ? You are at high risk for heart disease.  Cancer screening Lung Cancer  Lung cancer screening is recommended for adults 42-49 years old who are at high risk for lung cancer because of a history of smoking.  A yearly low-dose CT scan of the lungs is recommended for people who: ? Currently smoke. ? Have quit within the past 15 years. ? Have at least a 30-pack-year history of smoking. A pack year is smoking an average of one pack of cigarettes a day for 1 year.  Yearly screening should continue until it has been 15 years since you quit.  Yearly screening should stop if you develop a health problem that would prevent you from having lung cancer treatment.  Breast Cancer  Practice breast self-awareness. This means understanding how your breasts normally appear and feel.  It also means doing regular breast self-exams. Let your health care provider know about any changes, no matter how small.  If you are in your 20s or 30s, you should have a clinical breast exam (CBE) by a health care provider every 1-3 years as part of a regular health exam.  If you are 75 or older, have a CBE every year. Also consider having a breast X-ray (mammogram) every year.  If you have a family history of breast cancer, talk to your health care provider about genetic screening.  If you are at high risk for breast cancer, talk to your health care provider about having an MRI and a mammogram every year.  Breast cancer gene (BRCA) assessment is recommended for women who have  family members with BRCA-related cancers. BRCA-related cancers include: ? Breast. ? Ovarian. ? Tubal. ? Peritoneal cancers.  Results of the assessment will determine the need for genetic counseling and BRCA1 and BRCA2 testing.  Cervical Cancer Your health care provider may recommend that you be screened regularly for cancer of the pelvic organs (ovaries, uterus, and vagina). This screening involves a pelvic examination, including checking for microscopic changes to the surface of your cervix (Pap test). You may be encouraged to have this screening done every 3 years, beginning at age 69.  For women ages 54-65, health care providers may recommend pelvic exams and Pap testing every 3 years, or they may recommend the Pap and pelvic exam, combined with testing for human papilloma virus (HPV), every 5 years. Some types of HPV increase your risk of cervical cancer. Testing for HPV may also be done on women of any age with unclear Pap test results.  Other health care providers may not recommend any screening for nonpregnant women who are considered low risk for pelvic cancer and who do not have symptoms. Ask your health care provider if a screening pelvic exam is right for you.  If you have had past treatment for cervical cancer or a condition that could lead to cancer, you need Pap tests and screening for cancer for at least 20 years after your treatment. If Pap tests have been discontinued, your risk factors (such as having a new sexual partner) need to be reassessed to determine if screening should resume. Some women have medical problems that increase the chance of getting cervical cancer. In these cases, your health care provider may recommend more frequent screening and Pap tests.  Colorectal Cancer  This type of cancer can be detected and often prevented.  Routine colorectal cancer screening usually begins at 52 years of age and continues through 52 years of age.  Your health care provider may  recommend screening at an earlier age if you have risk factors for colon cancer.  Your health care provider may also recommend using home test kits to check for hidden blood in the stool.  A small camera at the end of a tube can be used to examine your colon directly (sigmoidoscopy or colonoscopy). This is done to check for the earliest forms of colorectal cancer.  Routine screening usually begins at age 52.  Direct examination of the colon should be repeated every 5-10 years through 52 years of age. However, you may need to be screened more often if early forms of precancerous polyps or small growths are found.  Skin Cancer  Check your skin from head to toe regularly.  Tell your health care provider about any new moles or changes in moles, especially if there is a change in a mole's shape or color.  Also tell your health care provider if you have a mole that is  larger than the size of a pencil eraser.  Always use sunscreen. Apply sunscreen liberally and repeatedly throughout the day.  Protect yourself by wearing long sleeves, pants, a wide-brimmed hat, and sunglasses whenever you are outside.  Heart disease, diabetes, and high blood pressure  High blood pressure causes heart disease and increases the risk of stroke. High blood pressure is more likely to develop in: ? People who have blood pressure in the high end of the normal range (130-139/85-89 mm Hg). ? People who are overweight or obese. ? People who are African American.  If you are 67-38 years of age, have your blood pressure checked every 3-5 years. If you are 41 years of age or older, have your blood pressure checked every year. You should have your blood pressure measured twice-once when you are at a hospital or clinic, and once when you are not at a hospital or clinic. Record the average of the two measurements. To check your blood pressure when you are not at a hospital or clinic, you can use: ? An automated blood pressure  machine at a pharmacy. ? A home blood pressure monitor.  If you are between 25 years and 30 years old, ask your health care provider if you should take aspirin to prevent strokes.  Have regular diabetes screenings. This involves taking a blood sample to check your fasting blood sugar level. ? If you are at a normal weight and have a low risk for diabetes, have this test once every three years after 52 years of age. ? If you are overweight and have a high risk for diabetes, consider being tested at a younger age or more often. Preventing infection Hepatitis B  If you have a higher risk for hepatitis B, you should be screened for this virus. You are considered at high risk for hepatitis B if: ? You were born in a country where hepatitis B is common. Ask your health care provider which countries are considered high risk. ? Your parents were born in a high-risk country, and you have not been immunized against hepatitis B (hepatitis B vaccine). ? You have HIV or AIDS. ? You use needles to inject street drugs. ? You live with someone who has hepatitis B. ? You have had sex with someone who has hepatitis B. ? You get hemodialysis treatment. ? You take certain medicines for conditions, including cancer, organ transplantation, and autoimmune conditions.  Hepatitis C  Blood testing is recommended for: ? Everyone born from [birth year] through 1965. ? Anyone with known risk factors for hepatitis C.  Sexually transmitted infections (STIs)  You should be screened for sexually transmitted infections (STIs) including gonorrhea and chlamydia if: ? You are sexually active and are younger than 52 years of age. ? You are older than 52 years of age and your health care provider tells you that you are at risk for this type of infection. ? Your sexual activity has changed since you were last screened and you are at an increased risk for chlamydia or gonorrhea. Ask your health care provider if you are at  risk.  If you do not have HIV, but are at risk, it may be recommended that you take a prescription medicine daily to prevent HIV infection. This is called pre-exposure prophylaxis (PrEP). You are considered at risk if: ? You are sexually active and do not regularly use condoms or know the HIV status of your partner(s). ? You take drugs by injection. ? You are sexually active  with a partner who has HIV.  Talk with your health care provider about whether you are at high risk of being infected with HIV. If you choose to begin PrEP, you should first be tested for HIV. You should then be tested every 3 months for as long as you are taking PrEP. Pregnancy  If you are premenopausal and you may become pregnant, ask your health care provider about preconception counseling.  If you may become pregnant, take 400 to 800 micrograms (mcg) of folic acid every day.  If you want to prevent pregnancy, talk to your health care provider about birth control (contraception). Osteoporosis and menopause  Osteoporosis is a disease in which the bones lose minerals and strength with aging. This can result in serious bone fractures. Your risk for osteoporosis can be identified using a bone density scan.  If you are 87 years of age or older, or if you are at risk for osteoporosis and fractures, ask your health care provider if you should be screened.  Ask your health care provider whether you should take a calcium or vitamin D supplement to lower your risk for osteoporosis.  Menopause may have certain physical symptoms and risks.  Hormone replacement therapy may reduce some of these symptoms and risks. Talk to your health care provider about whether hormone replacement therapy is right for you. Follow these instructions at home:  Schedule regular health, dental, and eye exams.  Stay current with your immunizations.  Do not use any tobacco products including cigarettes, chewing tobacco, or electronic  cigarettes.  If you are pregnant, do not drink alcohol.  If you are breastfeeding, limit how much and how often you drink alcohol.  Limit alcohol intake to no more than 1 drink per day for nonpregnant women. One drink equals 12 ounces of beer, 5 ounces of wine, or 1 ounces of hard liquor.  Do not use street drugs.  Do not share needles.  Ask your health care provider for help if you need support or information about quitting drugs.  Tell your health care provider if you often feel depressed.  Tell your health care provider if you have ever been abused or do not feel safe at home. This information is not intended to replace advice given to you by your health care provider. Make sure you discuss any questions you have with your health care provider. Document Released: 10/19/2010 Document Revised: 09/11/2015 Document Reviewed: 01/07/2015 Elsevier Interactive Patient Education  [employer].

## 2017-12-01 DIAGNOSIS — H5203 Hypermetropia, bilateral: Secondary | ICD-10-CM | POA: Diagnosis not present

## 2017-12-01 DIAGNOSIS — H04123 Dry eye syndrome of bilateral lacrimal glands: Secondary | ICD-10-CM | POA: Diagnosis not present

## 2017-12-01 DIAGNOSIS — H52223 Regular astigmatism, bilateral: Secondary | ICD-10-CM | POA: Diagnosis not present

## 2017-12-01 DIAGNOSIS — H524 Presbyopia: Secondary | ICD-10-CM | POA: Diagnosis not present

## 2017-12-02 ENCOUNTER — Other Ambulatory Visit: Payer: BLUE CROSS/BLUE SHIELD

## 2017-12-02 DIAGNOSIS — Z01419 Encounter for gynecological examination (general) (routine) without abnormal findings: Secondary | ICD-10-CM | POA: Diagnosis not present

## 2017-12-03 LAB — HEMOGLOBIN A1C
Est. average glucose Bld gHb Est-mCnc: 111 mg/dL
Hgb A1c MFr Bld: 5.5 % (ref 4.8–5.6)

## 2017-12-03 LAB — LIPID PANEL
Chol/HDL Ratio: 2.4 ratio (ref 0.0–4.4)
Cholesterol, Total: 233 mg/dL — ABNORMAL HIGH (ref 100–199)
HDL: 97 mg/dL (ref >39)
LDL Calculated: 122 mg/dL — ABNORMAL HIGH (ref 0–99)
Triglycerides: 72 mg/dL (ref 0–149)
VLDL Cholesterol Cal: 14 mg/dL (ref 5–40)

## 2017-12-03 LAB — HEPATIC FUNCTION PANEL
ALT: 30 IU/L (ref 0–32)
AST: 41 IU/L — ABNORMAL HIGH (ref 0–40)
Albumin: 4.7 g/dL (ref 3.5–5.5)
Alkaline Phosphatase: 73 IU/L (ref 39–117)
Bilirubin Total: 0.4 mg/dL (ref 0.0–1.2)
Bilirubin, Direct: 0.12 mg/dL (ref 0.00–0.40)
Total Protein: 7.1 g/dL (ref 6.0–8.5)

## 2017-12-03 LAB — GLUCOSE, RANDOM: Glucose: 86 mg/dL (ref 65–99)

## 2017-12-03 LAB — TSH: TSH: 1.62 u[IU]/mL (ref 0.450–4.500)

## 2017-12-05 ENCOUNTER — Other Ambulatory Visit: Payer: Self-pay

## 2017-12-05 DIAGNOSIS — E785 Hyperlipidemia, unspecified: Secondary | ICD-10-CM

## 2018-01-11 ENCOUNTER — Encounter: Payer: BLUE CROSS/BLUE SHIELD | Admitting: Obstetrics and Gynecology

## 2018-01-17 ENCOUNTER — Encounter: Payer: BLUE CROSS/BLUE SHIELD | Admitting: Obstetrics and Gynecology

## 2018-06-08 ENCOUNTER — Other Ambulatory Visit: Payer: BLUE CROSS/BLUE SHIELD

## 2018-06-08 DIAGNOSIS — E785 Hyperlipidemia, unspecified: Secondary | ICD-10-CM

## 2018-06-09 LAB — LIPID PANEL
Chol/HDL Ratio: 2.2 ratio (ref 0.0–4.4)
Cholesterol, Total: 244 mg/dL — ABNORMAL HIGH (ref 100–199)
HDL: 109 mg/dL (ref >39)
LDL Calculated: 123 mg/dL — ABNORMAL HIGH (ref 0–99)
Triglycerides: 58 mg/dL (ref 0–149)
VLDL Cholesterol Cal: 12 mg/dL (ref 5–40)

## 2018-06-14 ENCOUNTER — Other Ambulatory Visit: Payer: Self-pay | Admitting: Obstetrics and Gynecology

## 2018-06-15 ENCOUNTER — Other Ambulatory Visit: Payer: Self-pay

## 2018-06-15 MED ORDER — ESTRADIOL-PROGESTERONE 1-100 MG PO CAPS: 1.0000 | ORAL_CAPSULE | Freq: Every day | ORAL | 6 refills | Status: DC

## 2018-10-19 ENCOUNTER — Other Ambulatory Visit: Payer: Self-pay

## 2018-10-19 MED ORDER — FLUTICASONE PROPIONATE 50 MCG/ACT NA SUSP: NASAL | 2 refills | Status: AC

## 2018-11-16 ENCOUNTER — Other Ambulatory Visit: Payer: Self-pay

## 2018-11-16 MED ORDER — PREMPRO 0.625-5 MG PO TABS: 1.0000 | ORAL_TABLET | Freq: Every day | ORAL | 6 refills | Status: DC

## 2018-12-05 ENCOUNTER — Encounter: Payer: BLUE CROSS/BLUE SHIELD | Admitting: Obstetrics and Gynecology

## 2018-12-14 ENCOUNTER — Encounter: Payer: Self-pay | Admitting: Obstetrics and Gynecology

## 2018-12-14 ENCOUNTER — Ambulatory Visit (INDEPENDENT_AMBULATORY_CARE_PROVIDER_SITE_OTHER): Payer: BC Managed Care – PPO | Admitting: Obstetrics and Gynecology

## 2018-12-14 ENCOUNTER — Other Ambulatory Visit: Payer: Self-pay

## 2018-12-14 VITALS — BP 117/70 | HR 66 | Ht <= 58 in | Wt 132.8 lb

## 2018-12-14 DIAGNOSIS — Z1239 Encounter for other screening for malignant neoplasm of breast: Secondary | ICD-10-CM

## 2018-12-14 DIAGNOSIS — E785 Hyperlipidemia, unspecified: Secondary | ICD-10-CM

## 2018-12-14 DIAGNOSIS — Z131 Encounter for screening for diabetes mellitus: Secondary | ICD-10-CM

## 2018-12-14 DIAGNOSIS — Z01419 Encounter for gynecological examination (general) (routine) without abnormal findings: Secondary | ICD-10-CM

## 2018-12-14 DIAGNOSIS — Z79890 Hormone replacement therapy (postmenopausal): Secondary | ICD-10-CM

## 2018-12-14 NOTE — Patient Instructions (Addendum)
Health Maintenance, Female Adopting a healthy lifestyle and getting preventive care are important in promoting health and wellness. Ask your health care provider about:  The right schedule for you to have regular tests and exams.  Things you can do on your own to prevent diseases and keep yourself healthy. What should I know about diet, weight, and exercise? Eat a healthy diet   Eat a diet that includes plenty of vegetables, fruits, low-fat dairy products, and lean protein.  Do not eat a lot of foods that are high in solid fats, added sugars, or sodium. Maintain a healthy weight Body mass index (BMI) is used to identify weight problems. It estimates body fat based on height and weight. Your health care provider can help determine your BMI and help you achieve or maintain a healthy weight. Get regular exercise Get regular exercise. This is one of the most important things you can do for your health. Most adults should:  Exercise for at least 150 minutes each week. The exercise should increase your heart rate and make you sweat (moderate-intensity exercise).  Do strengthening exercises at least twice a week. This is in addition to the moderate-intensity exercise.  Spend less time sitting. Even light physical activity can be beneficial. Watch cholesterol and blood lipids Have your blood tested for lipids and cholesterol at 53 years of age, then have this test every 5 years. Have your cholesterol levels checked more often if:  Your lipid or cholesterol levels are high.  You are older than 53 years of age.  You are at high risk for heart disease. What should I know about cancer screening? Depending on your health history and family history, you may need to have cancer screening at various ages. This may include screening for:  Breast cancer.  Cervical cancer.  Colorectal cancer.  Skin cancer.  Lung cancer. What should I know about heart disease, diabetes, and high blood  pressure? Blood pressure and heart disease  High blood pressure causes heart disease and increases the risk of stroke. This is more likely to develop in people who have high blood pressure readings, are of African descent, or are overweight.  Have your blood pressure checked: ? Every 3-5 years if you are 18-39 years of age. ? Every year if you are 40 years old or older. Diabetes Have regular diabetes screenings. This checks your fasting blood sugar level. Have the screening done:  Once every three years after age 40 if you are at a normal weight and have a low risk for diabetes.  More often and at a younger age if you are overweight or have a high risk for diabetes. What should I know about preventing infection? Hepatitis B If you have a higher risk for hepatitis B, you should be screened for this virus. Talk with your health care provider to find out if you are at risk for hepatitis B infection. Hepatitis C Testing is recommended for:  Everyone born from 1945 through 1965.  Anyone with known risk factors for hepatitis C. Sexually transmitted infections (STIs)  Get screened for STIs, including gonorrhea and chlamydia, if: ? You are sexually active and are younger than 53 years of age. ? You are older than 53 years of age and your health care provider tells you that you are at risk for this type of infection. ? Your sexual activity has changed since you were last screened, and you are at increased risk for chlamydia or gonorrhea. Ask your health care provider if   you are at risk.  Ask your health care provider about whether you are at high risk for HIV. Your health care provider may recommend a prescription medicine to help prevent HIV infection. If you choose to take medicine to prevent HIV, you should first get tested for HIV. You should then be tested every 3 months for as long as you are taking the medicine. Pregnancy  If you are about to stop having your period (premenopausal) and  you may become pregnant, seek counseling before you get pregnant.  Take 400 to 800 micrograms (mcg) of folic acid every day if you become pregnant.  Ask for birth control (contraception) if you want to prevent pregnancy. Osteoporosis and menopause Osteoporosis is a disease in which the bones lose minerals and strength with aging. This can result in bone fractures. If you are 65 years old or older, or if you are at risk for osteoporosis and fractures, ask your health care provider if you should:  Be screened for bone loss.  Take a calcium or vitamin D supplement to lower your risk of fractures.  Be given hormone replacement therapy (HRT) to treat symptoms of menopause. Follow these instructions at home: Lifestyle  Do not use any products that contain nicotine or tobacco, such as cigarettes, e-cigarettes, and chewing tobacco. If you need help quitting, ask your health care provider.  Do not use street drugs.  Do not share needles.  Ask your health care provider for help if you need support or information about quitting drugs. Alcohol use  Do not drink alcohol if: ? Your health care provider tells you not to drink. ? You are pregnant, may be pregnant, or are planning to become pregnant.  If you drink alcohol: ? Limit how much you use to 0-1 drink a day. ? Limit intake if you are breastfeeding.  Be aware of how much alcohol is in your drink. In the [place] one drink equals one 12 oz bottle of beer (355 mL), one 5 oz glass of wine (148 mL), or one 1 oz glass of hard liquor (44 mL). General instructions  Schedule regular health, dental, and eye exams.  Stay current with your vaccines.  Tell your health care provider if: ? You often feel depressed. ? You have ever been abused or do not feel safe at home. Summary  Adopting a healthy lifestyle and getting preventive care are important in promoting health and wellness.  Follow your health care provider's instructions about healthy  diet, exercising, and getting tested or screened for diseases.  Follow your health care provider's instructions on monitoring your cholesterol and blood pressure. This information is not intended to replace advice given to you by your health care provider. Make sure you discuss any questions you have with your health care provider. Document Released: 10/19/2010 Document Revised: 03/29/2018 Document Reviewed: 03/29/2018 Elsevier Patient Education  2020 Elsevier Inc.   Breast Self-Awareness Breast self-awareness is knowing how your breasts look and feel. Doing breast self-awareness is important. It allows you to catch a breast problem early while it is still small and can be treated. All women should do breast self-awareness, including women who have had breast implants. Tell your doctor if you notice a change in your breasts. What you need:  A mirror.  A well-lit room. How to do a breast self-exam A breast self-exam is one way to learn what is normal for your breasts and to check for changes. To do a breast self-exam: Look for changes  1.   Take off all the clothes above your waist. 2. Stand in front of a mirror in a room with good lighting. 3. Put your hands on your hips. 4. Push your hands down. 5. Look at your breasts and nipples in the mirror to see if one breast or nipple looks different from the other. Check to see if: ? The shape of one breast is different. ? The size of one breast is different. ? There are wrinkles, dips, and bumps in one breast and not the other. 6. Look at each breast for changes in the skin, such as: ? Redness. ? Scaly areas. 7. Look for changes in your nipples, such as: ? Liquid around the nipples. ? Bleeding. ? Dimpling. ? Redness. ? A change in where the nipples are. Feel for changes  1. Lie on your back on the floor. 2. Feel each breast. To do this, follow these steps: ? Pick a breast to feel. ? Put the arm closest to that breast above your head.  ? Use your other arm to feel the nipple area of your breast. Feel the area with the pads of your three middle fingers by making small circles with your fingers. For the first circle, press lightly. For the second circle, press harder. For the third circle, press even harder. ? Keep making circles with your fingers at the different pressures as you move down your breast. Stop when you feel your ribs. ? Move your fingers a little toward the center of your body. ? Start making circles with your fingers again, this time going up until you reach your collarbone. ? Keep making up-and-down circles until you reach your armpit. Remember to keep using the three pressures. ? Feel the other breast in the same way. 3. Sit or stand in the tub or shower. 4. With soapy water on your skin, feel each breast the same way you did in step 2 when you were lying on the floor. Write down what you find Writing down what you find can help you remember what to tell your doctor. Write down:  What is normal for each breast.  Any changes you find in each breast, including: ? The kind of changes you find. ? Whether you have pain. ? Size and location of any lumps.  When you last had your menstrual period. General tips  Check your breasts every month.  If you are breastfeeding, the best time to check your breasts is after you feed your baby or after you use a breast pump.  If you get menstrual periods, the best time to check your breasts is 5-7 days after your menstrual period is over.  With time, you will become comfortable with the self-exam, and you will begin to know if there are changes in your breasts. Contact a doctor if you:  See a change in the shape or size of your breasts or nipples.  See a change in the skin of your breast or nipples, such as red or scaly skin.  Have fluid coming from your nipples that is not normal.  Find a lump or thick area that was not there before.  Have pain in your breasts.   Have any concerns about your breast health. Summary  Breast self-awareness includes looking for changes in your breasts, as well as feeling for changes within your breasts.  Breast self-awareness should be done in front of a mirror in a well-lit room.  You should check your breasts every month. If you get menstrual   periods, the best time to check your breasts is 5-7 days after your menstrual period is over.  Let your doctor know of any changes you see in your breasts, including changes in size, changes on the skin, pain or tenderness, or fluid from your nipples that is not normal. This information is not intended to replace advice given to you by your health care provider. Make sure you discuss any questions you have with your health care provider. Document Released: 09/22/2007 Document Revised: 11/22/2017 Document Reviewed: 11/22/2017 Elsevier Patient Education  2020 Elsevier Inc.  

## 2018-12-14 NOTE — Progress Notes (Signed)
Pt is present for annual exam. Pt stated that she was doing well no problems. Pt stated that the prempro improved her hot flashes.

## 2018-12-14 NOTE — Progress Notes (Signed)
ANNUAL PREVENTATIVE CARE GYNECOLOGY  ENCOUNTER NOTE  Subjective:       Debra Haynes is a 53 y.o. G83P2002 married female here for a routine annual gynecologic exam. She is transitioning care from Dr. Hassell Done Defrancesco who has retired. The patient is sexually active. The patient is taking hormone replacement therapy (has been using x ~ 2-3 years). Patient denies post-menopausal vaginal bleeding. The patient wears seatbelts: yes. The patient participates in regular exercise: yes. Has the patient ever been transfused or tattooed?: no. The patient reports that there is not domestic violence in her life.  Current complaints: 1.  None.     Gynecologic History No LMP recorded. (Menstrual status: Perimenopausal). Contraception: post menopausal status Last Pap: 10/12/2016. Results were: normal Last mammogram: 11/01/2016. Results were: normal Last Colonoscopy: 12/27/2014. Repeat colonoscopy in 5 years due to family history.  Last Dexa Scan: 10/26/2016. Results were: osteopenia, T score -1.6.    Obstetric History OB History  Gravida Para Term Preterm AB Living  2 2 2     2   SAB TAB Ectopic Multiple Live Births          2    # Outcome Date GA Lbr Len/2nd Weight Sex Delivery Anes PTL Lv  2 Term 1997   8 lb 8 oz (3.856 kg) M Vag-Spont   LIV  1 Term 1994   7 lb 9.6 oz (3.447 kg) M Vag-Spont   LIV    Past Medical History:  Diagnosis Date  . AR (allergic rhinitis)   . Hypertension   . TMJ (dislocation of temporomandibular joint)   . Vitamin D deficiency     Family History  Problem Relation Age of Onset  . Diabetes Father   . Hypertension Father   . Breast cancer Maternal Aunt   . Colon cancer Maternal Grandfather   . Hypertension Mother   . Hyperthyroidism Mother        Ablation   . Breast cancer Cousin 66       mat cousin  . Ovarian cancer Neg Hx     Past Surgical History:  Procedure Laterality Date  . NO PAST SURGERIES      Social History   Socioeconomic History  . Marital  status: Married    Spouse name: Not on file  . Number of children: 2  . Years of education: Not on file  . Highest education level: Not on file  Occupational History  . Not on file  Social Needs  . Financial resource strain: Not on file  . Food insecurity    Worry: Not on file    Inability: Not on file  . Transportation needs    Medical: Not on file    Non-medical: Not on file  Tobacco Use  . Smoking status: Never Smoker  . Smokeless tobacco: Never Used  Substance and Sexual Activity  . Alcohol use: Yes    Comment: 3 DAYS WEEK  . Drug use: No  . Sexual activity: Not Currently    Birth control/protection: None  Lifestyle  . Physical activity    Days per week: 4 days    Minutes per session: 60 min  . Stress: Not on file  Relationships  . Social Herbalist on phone: Not on file    Gets together: Not on file    Attends religious service: Not on file    Active member of club or organization: Not on file    Attends meetings of clubs or organizations:  Not on file    Relationship status: Not on file  . Intimate partner violence    Fear of current or ex partner: Not on file    Emotionally abused: Not on file    Physically abused: Not on file    Forced sexual activity: Not on file  Other Topics Concern  . Not on file  Social History Narrative   Recently moved here from [place], [place] (2017)   Husband is an Forensic psychologist   2 Adult Children     Current Outpatient Medications on File Prior to Visit  Medication Sig Dispense Refill  . cholecalciferol (VITAMIN D) 1000 units tablet Take 1,000 Units by mouth daily.    Marland Kitchen estrogen, conjugated,-medroxyprogesterone (PREMPRO) 0.625-5 MG tablet Take 1 tablet by mouth daily. 30 tablet 6  . fluticasone (FLONASE) 50 MCG/ACT nasal spray SHAKE LIQUID AND USE 1 SPRAY IN EACH NOSTRIL DAILY 16 g 2  . labetalol (NORMODYNE) 100 MG tablet Take 1 tablet (100 mg total) by mouth once for 1 dose. 90 tablet 3  . loratadine (CLARITIN) 10 MG  tablet Take 10 mg by mouth daily.    . Melatonin-Pyridoxine (MELATIN PO) Take by mouth.     No current facility-administered medications on file prior to visit.     Allergies  Allergen Reactions  . Azithromycin   . Yaz [Drospirenone-Ethinyl Estradiol] Rash      Review of Systems ROS Review of Systems - General ROS: negative for - chills, fatigue, fever, hot flashes, night sweats, weight gain or weight loss Psychological ROS: negative for - anxiety, decreased libido, depression, mood swings, physical abuse or sexual abuse Ophthalmic ROS: negative for - blurry vision, eye pain or loss of vision ENT ROS: negative for - headaches, hearing change, visual changes or vocal changes Allergy and Immunology ROS: negative for - hives, itchy/watery eyes or seasonal allergies Hematological and Lymphatic ROS: negative for - bleeding problems, bruising, swollen lymph nodes or weight loss Endocrine ROS: negative for - galactorrhea, hair pattern changes, hot flashes, malaise/lethargy, mood swings, palpitations, polydipsia/polyuria, skin changes, temperature intolerance or unexpected weight changes Breast ROS: negative for - new or changing breast lumps or nipple discharge Respiratory ROS: negative for - cough or shortness of breath Cardiovascular ROS: negative for - chest pain, irregular heartbeat, palpitations or shortness of breath Gastrointestinal ROS: no abdominal pain, change in bowel habits, or black or bloody stools Genito-Urinary ROS: no dysuria, trouble voiding, or hematuria Musculoskeletal ROS: negative for - joint pain or joint stiffness Neurological ROS: negative for - bowel and bladder control changes Dermatological ROS: negative for rash and skin lesion changes   Objective:   BP 117/70   Pulse 66   Ht 4\' 10"  (1.473 m)   Wt 132 lb 12.8 oz (60.2 kg)   BMI 27.76 kg/m  CONSTITUTIONAL: Well-developed, well-nourished female in no acute distress. Overweight.   PSYCHIATRIC: Normal mood  and affect. Normal behavior. Normal judgment and thought content. Oto: Alert and oriented to person, place, and time. Normal muscle tone coordination. No cranial nerve deficit noted. HENT:  Normocephalic, atraumatic, External right and left ear normal. Oropharynx is clear and moist EYES: Conjunctivae and EOM are normal. Pupils are equal, round, and reactive to light. No scleral icterus.  NECK: Normal range of motion, supple, no masses.  Normal thyroid.  SKIN: Skin is warm and dry. No rash noted. Not diaphoretic. No erythema. No pallor. CARDIOVASCULAR: Normal heart rate noted, regular rhythm, no murmur. RESPIRATORY: Clear to auscultation bilaterally. Effort and breath sounds  normal, no problems with respiration noted. BREASTS: Symmetric in size. No masses, skin changes, nipple drainage, or lymphadenopathy. ABDOMEN: Soft, normal bowel sounds, no distention noted.  No tenderness, rebound or guarding.  BLADDER: Normal PELVIC:  Bladder no bladder distension noted  Urethra: normal appearing urethra with no masses, tenderness or lesions  Vulva: normal appearing vulva with no masses, tenderness or lesions  Vagina: normal appearing vagina with normal color and discharge, no lesions  Cervix: normal appearing cervix without discharge or lesions  Uterus: uterus is normal size, shape, consistency and nontender  Adnexa: normal adnexa in size, nontender and no masses  RV: External Exam NormaI, No Rectal Masses and Normal Sphincter tone  MUSCULOSKELETAL: Normal range of motion. No tenderness.  No cyanosis, clubbing, or edema.  2+ distal pulses. LYMPHATIC: No Axillary, Supraclavicular, or Inguinal Adenopathy.   Labs: No results found for: WBC, HGB, HCT, MCV, PLT  No results found for: CREATININE, BUN, NA, K, CL, CO2  Lab Results  Component Value Date   ALT 30 12/02/2017   AST 41 (H) 12/02/2017   ALKPHOS 73 12/02/2017   BILITOT 0.4 12/02/2017    Lab Results  Component Value Date   CHOL 244  (H) 06/08/2018   HDL 109 06/08/2018   LDLCALC 123 (H) 06/08/2018   TRIG 58 06/08/2018   CHOLHDL 2.2 06/08/2018    Lab Results  Component Value Date   TSH 1.620 12/02/2017    Lab Results  Component Value Date   HGBA1C 5.5 12/02/2017     Assessment:   Well woman exam with routine gynecological exam  Screening for breast cancer Screening for diabetes mellitus Dyslipidemia Post-menopause on HRT (hormone replacement therapy) Overweight  Osteopenia  Plan:  Pap: Not needed. Currently up to date.  Mammogram: Ordered Stool Guaiac Testing:  Not Indicated. Up to date on colonoscopy. Due again in 1 year.  Labs: CBC, CMP, Lipid 1, TSH and Hemoglobin A1C Routine preventative health maintenance measures emphasized: Exercise/Diet/Weight control, Tobacco Warnings, Alcohol/Substance use risks, Stress Management and Safe Sex Menopausal syndrome. This woman has a ~3 year history of HRT exposure with new study data showing increased risk of thrombo-embolic events such as myocardial infarction stroke and breast cancer after 4 or more years exposure to combination products with estrogen and progesterone. She understands the benefits as well as the risks discussed above. She  is advised that she may wish to discontinue the HRT at any time. Her personal risk factors have been reviewed carefully with her today. Osteopenia should be helped by HRT, also encourage Calcium and Vitamin D supplementation.  Return to [place], MD [facility]

## 2018-12-15 LAB — CBC
Hematocrit: 37.9 % (ref 34.0–46.6)
Hemoglobin: 12.8 g/dL (ref 11.1–15.9)
MCH: 30.7 pg (ref 26.6–33.0)
MCHC: 33.8 g/dL (ref 31.5–35.7)
MCV: 91 fL (ref 79–97)
Platelets: 280 10*3/uL (ref 150–450)
RBC: 4.17 x10E6/uL (ref 3.77–5.28)
RDW: 12.2 % (ref 11.7–15.4)
WBC: 5.9 10*3/uL (ref 3.4–10.8)

## 2018-12-15 LAB — COMPREHENSIVE METABOLIC PANEL
ALT: 15 IU/L (ref 0–32)
AST: 17 IU/L (ref 0–40)
Albumin/Globulin Ratio: 2.5 — ABNORMAL HIGH (ref 1.2–2.2)
Albumin: 5 g/dL — ABNORMAL HIGH (ref 3.8–4.9)
Alkaline Phosphatase: 66 IU/L (ref 39–117)
BUN/Creatinine Ratio: 21 (ref 9–23)
BUN: 15 mg/dL (ref 6–24)
Bilirubin Total: 0.3 mg/dL (ref 0.0–1.2)
CO2: 23 mmol/L (ref 20–29)
Calcium: 9.9 mg/dL (ref 8.7–10.2)
Chloride: 101 mmol/L (ref 96–106)
Creatinine, Ser: 0.73 mg/dL (ref 0.57–1.00)
GFR calc Af Amer: 109 mL/min/{1.73_m2} (ref >59)
GFR calc non Af Amer: 94 mL/min/{1.73_m2} (ref >59)
Globulin, Total: 2 g/dL (ref 1.5–4.5)
Glucose: 85 mg/dL (ref 65–99)
Potassium: 4 mmol/L (ref 3.5–5.2)
Sodium: 140 mmol/L (ref 134–144)
Total Protein: 7 g/dL (ref 6.0–8.5)

## 2019-01-18 ENCOUNTER — Other Ambulatory Visit: Payer: Self-pay

## 2019-01-18 ENCOUNTER — Telehealth: Payer: Self-pay

## 2019-01-18 MED ORDER — LABETALOL HCL 100 MG PO TABS: 100.0000 mg | ORAL_TABLET | Freq: Every day | ORAL | 3 refills | Status: DC

## 2019-01-18 MED ORDER — LABETALOL HCL 100 MG PO TABS: 100.0000 mg | ORAL_TABLET | Freq: Once | ORAL | 3 refills | Status: DC

## 2019-01-18 NOTE — Telephone Encounter (Signed)
Question about the directions on labetalol 100mg .  Please call Manuela Schwartz at [phone]. [facility]

## 2019-01-25 DIAGNOSIS — L918 Other hypertrophic disorders of the skin: Secondary | ICD-10-CM | POA: Diagnosis not present

## 2019-01-25 DIAGNOSIS — Z1283 Encounter for screening for malignant neoplasm of skin: Secondary | ICD-10-CM | POA: Diagnosis not present

## 2019-01-25 DIAGNOSIS — L814 Other melanin hyperpigmentation: Secondary | ICD-10-CM | POA: Diagnosis not present

## 2019-01-25 DIAGNOSIS — L821 Other seborrheic keratosis: Secondary | ICD-10-CM | POA: Diagnosis not present

## 2019-02-07 ENCOUNTER — Ambulatory Visit
Admission: RE | Admit: 2019-02-07 | Discharge: 2019-02-07 | Disposition: A | Discharge status: Home / Self Care | Payer: BC Managed Care – PPO | Source: Ambulatory Visit | Attending: Obstetrics and Gynecology | Admitting: Obstetrics and Gynecology

## 2019-02-07 DIAGNOSIS — Z1239 Encounter for other screening for malignant neoplasm of breast: Secondary | ICD-10-CM

## 2019-02-07 DIAGNOSIS — Z1231 Encounter for screening mammogram for malignant neoplasm of breast: Secondary | ICD-10-CM | POA: Diagnosis not present

## 2019-03-28 DIAGNOSIS — H40033 Anatomical narrow angle, bilateral: Secondary | ICD-10-CM | POA: Diagnosis not present

## 2019-04-05 DIAGNOSIS — S63591A Other specified sprain of right wrist, initial encounter: Secondary | ICD-10-CM | POA: Diagnosis not present

## 2019-06-08 ENCOUNTER — Other Ambulatory Visit: Payer: Self-pay | Admitting: Obstetrics and Gynecology

## 2019-07-05 ENCOUNTER — Ambulatory Visit: Payer: BC Managed Care – PPO

## 2019-07-06 ENCOUNTER — Ambulatory Visit: Payer: BC Managed Care – PPO | Attending: Internal Medicine

## 2019-07-06 DIAGNOSIS — Z23 Encounter for immunization: Secondary | ICD-10-CM

## 2019-07-06 NOTE — Progress Notes (Signed)
   Covid-19 Vaccination Clinic  Name:  Debra Haynes    MRN: [id number] DOB: Jun 14, 1965  07/06/2019  [name] was observed post Covid-19 immunization for 15 minutes without incident. She was provided with Vaccine Information Sheet and instruction to access the V-Safe system.   [name] was instructed to call 911 with any severe reactions post vaccine: Marland Kitchen Difficulty breathing  . Swelling of face and throat  . A fast heartbeat  . A bad rash all over body  . Dizziness and weakness   Immunizations Administered    Name Date Dose VIS Date Route   Pfizer COVID-19 Vaccine 07/06/2019  3:10 PM 0.3 mL 03/30/2019 Intramuscular   Manufacturer: [place]   Lot: [id number]   [place]: [id number]

## 2019-08-01 ENCOUNTER — Ambulatory Visit: Payer: BC Managed Care – PPO | Attending: Internal Medicine

## 2019-08-01 DIAGNOSIS — Z23 Encounter for immunization: Secondary | ICD-10-CM

## 2019-08-01 NOTE — Progress Notes (Signed)
   Covid-19 Vaccination Clinic  Name:  Laketha Fu    MRN: [id number] DOB: 01-14-1966  08/01/2019  Ms. Corro was observed post Covid-19 immunization for 15 minutes without incident. She was provided with Vaccine Information Sheet and instruction to access the V-Safe system.   Ms. Cooprider was instructed to call 911 with any severe reactions post vaccine: Marland Kitchen Difficulty breathing  . Swelling of face and throat  . A fast heartbeat  . A bad rash all over body  . Dizziness and weakness   Immunizations Administered    Name Date Dose VIS Date Route   Pfizer COVID-19 Vaccine 08/01/2019  9:50 AM 0.3 mL 03/30/2019 Intramuscular   Manufacturer: [place]   Lot: [id number]   [place]: [id number]

## 2019-11-15 ENCOUNTER — Other Ambulatory Visit: Payer: Self-pay | Admitting: Obstetrics and Gynecology

## 2019-12-19 ENCOUNTER — Encounter: Payer: Self-pay | Admitting: Obstetrics and Gynecology

## 2019-12-19 ENCOUNTER — Ambulatory Visit (INDEPENDENT_AMBULATORY_CARE_PROVIDER_SITE_OTHER): Payer: BC Managed Care – PPO | Admitting: Obstetrics and Gynecology

## 2019-12-19 ENCOUNTER — Other Ambulatory Visit: Payer: Self-pay

## 2019-12-19 ENCOUNTER — Other Ambulatory Visit: Payer: Self-pay | Admitting: Obstetrics and Gynecology

## 2019-12-19 ENCOUNTER — Other Ambulatory Visit (HOSPITAL_COMMUNITY)
Admission: RE | Admit: 2019-12-19 | Discharge: 2019-12-19 | Disposition: A | Discharge status: Home / Self Care | Payer: BC Managed Care – PPO | Source: Ambulatory Visit | Attending: Obstetrics and Gynecology | Admitting: Obstetrics and Gynecology

## 2019-12-19 VITALS — BP 129/81 | HR 85 | Ht <= 58 in | Wt 135.7 lb

## 2019-12-19 DIAGNOSIS — Z01419 Encounter for gynecological examination (general) (routine) without abnormal findings: Secondary | ICD-10-CM | POA: Diagnosis not present

## 2019-12-19 DIAGNOSIS — Z124 Encounter for screening for malignant neoplasm of cervix: Secondary | ICD-10-CM | POA: Insufficient documentation

## 2019-12-19 DIAGNOSIS — Z79890 Hormone replacement therapy (postmenopausal): Secondary | ICD-10-CM

## 2019-12-19 DIAGNOSIS — Z1231 Encounter for screening mammogram for malignant neoplasm of breast: Secondary | ICD-10-CM

## 2019-12-19 DIAGNOSIS — Z800 Family history of malignant neoplasm of digestive organs: Secondary | ICD-10-CM

## 2019-12-19 DIAGNOSIS — E785 Hyperlipidemia, unspecified: Secondary | ICD-10-CM

## 2019-12-19 DIAGNOSIS — Z1211 Encounter for screening for malignant neoplasm of colon: Secondary | ICD-10-CM

## 2019-12-19 DIAGNOSIS — E663 Overweight: Secondary | ICD-10-CM

## 2019-12-19 NOTE — Progress Notes (Signed)
Pt present for annual exam. Pt stated that she was doing well no problems.  

## 2019-12-19 NOTE — Progress Notes (Addendum)
ANNUAL PREVENTATIVE CARE GYNECOLOGY  ENCOUNTER NOTE  Subjective:       Debra Haynes is a 54 y.o. G74P2002 married female here for a routine annual gynecologic exam.  The patient is taking hormone replacement therapy (has been using x ~3-4 years). Patient denies post-menopausal vaginal bleeding. The patient wears seatbelts: yes.   Current complaints: 1.  None.  Overall doing well, no complaints. Reports she is trying to adopt an overall healthier lifestyle.    Gynecologic History Patient's last menstrual period was 06/17/2017 (exact date). Contraception: post menopausal status Last Pap: 10/12/2016. Results were: normal Last mammogram: 11/01/2016. Results were: normal Last Colonoscopy: 12/27/2014 (performed in [place], [place], records reviewed, normal). Repeat colonoscopy in 5 years due to family history.  Last Dexa Scan: 10/26/2016. Results were: osteopenia, T score -1.6.    Obstetric History OB History  Gravida Para Term Preterm AB Living  2 2 2     2   SAB TAB Ectopic Multiple Live Births          2    # Outcome Date GA Lbr Len/2nd Weight Sex Delivery Anes PTL Lv  2 Term 1997   8 lb 8 oz (3.856 kg) M Vag-Spont   LIV  1 Term 1994   7 lb 9.6 oz (3.447 kg) M Vag-Spont   LIV    Past Medical History:  Diagnosis Date  . AR (allergic rhinitis)   . Hypertension   . TMJ (dislocation of temporomandibular joint)   . Vitamin D deficiency     Family History  Problem Relation Age of Onset  . Diabetes Father   . Hypertension Father   . Breast cancer Maternal Aunt   . Colon cancer Maternal Grandfather   . Hypertension Mother   . Hyperthyroidism Mother        Ablation   . Breast cancer Cousin 6       mat cousin  . Ovarian cancer Neg Hx     Past Surgical History:  Procedure Laterality Date  . NO PAST SURGERIES      Social History   Socioeconomic History  . Marital status: Married    Spouse name: Not on file  . Number of children: 2  . Years of education: Not on file  .  Highest education level: Not on file  Occupational History  . Not on file  Tobacco Use  . Smoking status: Never Smoker  . Smokeless tobacco: Never Used  Vaping Use  . Vaping Use: Never used  Substance and Sexual Activity  . Alcohol use: Yes    Comment: 3 DAYS WEEK  . Drug use: No  . Sexual activity: Not Currently    Birth control/protection: None  Other Topics Concern  . Not on file  Social History Narrative   Recently moved here from [place], [place] (2017)   Husband is an Forensic psychologist   2 Adult Children    Social Determinants of Health   Financial Resource Strain:   . Difficulty of Paying Living Expenses: Not on file  Food Insecurity:   . Worried About Charity fundraiser in the Last Year: Not on file  . Ran Out of Food in the Last Year: Not on file  Transportation Needs:   . Lack of Transportation (Medical): Not on file  . Lack of Transportation (Non-Medical): Not on file  Physical Activity:   . Days of Exercise per Week: Not on file  . Minutes of Exercise per Session: Not on file  Stress:   .  Feeling of Stress : Not on file  Social Connections:   . Frequency of Communication with Friends and Family: Not on file  . Frequency of Social Gatherings with Friends and Family: Not on file  . Attends Religious Services: Not on file  . Active Member of Clubs or Organizations: Not on file  . Attends Archivist Meetings: Not on file  . Marital Status: Not on file  Intimate Partner Violence:   . Fear of Current or Ex-Partner: Not on file  . Emotionally Abused: Not on file  . Physically Abused: Not on file  . Sexually Abused: Not on file    Current Outpatient Medications on File Prior to Visit  Medication Sig Dispense Refill  . fluticasone (FLONASE) 50 MCG/ACT nasal spray SHAKE LIQUID AND USE 1 SPRAY IN EACH NOSTRIL DAILY 16 g 2  . labetalol (NORMODYNE) 100 MG tablet TAKE 1 TABLET(100 MG) BY MOUTH DAILY 90 tablet 0  . loratadine (CLARITIN) 10 MG tablet Take 10 mg by  mouth daily.    . Melatonin-Pyridoxine (MELATIN PO) Take by mouth.    . Multiple Vitamins-Minerals (MULTIVITAMIN GUMMIES WOMENS PO) Take by mouth.    Marland Kitchen PREMPRO 0.625-5 MG tablet TAKE 1 TABLET BY MOUTH DAILY 90 tablet 2   No current facility-administered medications on file prior to visit.    Allergies  Allergen Reactions  . Azithromycin   . Yaz [Drospirenone-Ethinyl Estradiol] Rash      Review of Systems ROS Review of Systems - General ROS: negative for - chills, fatigue, fever, hot flashes, night sweats, weight gain or weight loss Psychological ROS: negative for - anxiety, decreased libido, depression, mood swings, physical abuse or sexual abuse Ophthalmic ROS: negative for - blurry vision, eye pain or loss of vision ENT ROS: negative for - headaches, hearing change, visual changes or vocal changes Allergy and Immunology ROS: negative for - hives, itchy/watery eyes or seasonal allergies Hematological and Lymphatic ROS: negative for - bleeding problems, bruising, swollen lymph nodes or weight loss Endocrine ROS: negative for - galactorrhea, hair pattern changes, hot flashes, malaise/lethargy, mood swings, palpitations, polydipsia/polyuria, skin changes, temperature intolerance or unexpected weight changes Breast ROS: negative for - new or changing breast lumps or nipple discharge Respiratory ROS: negative for - cough or shortness of breath Cardiovascular ROS: negative for - chest pain, irregular heartbeat, palpitations or shortness of breath Gastrointestinal ROS: no abdominal pain, change in bowel habits, or black or bloody stools Genito-Urinary ROS: no dysuria, trouble voiding, or hematuria Musculoskeletal ROS: negative for - joint pain or joint stiffness Neurological ROS: negative for - bowel and bladder control changes Dermatological ROS: negative for rash and skin lesion changes   Objective:   BP 129/81   Pulse 85   Ht 4\' 10"  (1.473 m)   Wt 135 lb 11.2 oz (61.6 kg)   BMI  28.36 kg/m  CONSTITUTIONAL: Well-developed, well-nourished female in no acute distress. Overweight.   PSYCHIATRIC: Normal mood and affect. Normal behavior. Normal judgment and thought content. [place]: Alert and oriented to person, place, and time. Normal muscle tone coordination. No cranial nerve deficit noted. HENT:  Normocephalic, atraumatic, External right and left ear normal. Oropharynx is clear and moist EYES: Conjunctivae and EOM are normal. Pupils are equal, round, and reactive to light. No scleral icterus.  NECK: Normal range of motion, supple, no masses.  Normal thyroid.  SKIN: Skin is warm and dry. No rash noted. Not diaphoretic. No erythema. No pallor. CARDIOVASCULAR: Normal heart rate noted, regular rhythm, no  murmur. RESPIRATORY: Clear to auscultation bilaterally. Effort and breath sounds normal, no problems with respiration noted. BREASTS: Symmetric in size. No masses, skin changes, nipple drainage, or lymphadenopathy. ABDOMEN: Soft, normal bowel sounds, no distention noted.  No tenderness, rebound or guarding.  BLADDER: Normal PELVIC:  Bladder no bladder distension noted  Urethra: normal appearing urethra with no masses, tenderness or lesions  Vulva: normal appearing vulva with no masses, tenderness or lesions  Vagina: normal appearing vagina with normal color and discharge, no lesions  Cervix: normal appearing cervix without discharge or lesions  Uterus: uterus is normal size, shape, consistency and nontender  Adnexa: normal adnexa in size, nontender and no masses  RV: External Exam NormaI, No Rectal Masses and Normal Sphincter tone  MUSCULOSKELETAL: Normal range of motion. No tenderness.  No cyanosis, clubbing, or edema.  2+ distal pulses. LYMPHATIC: No Axillary, Supraclavicular, or Inguinal Adenopathy.   Labs: Lab Results  Component Value Date   WBC 5.9 12/14/2018   HGB 12.8 12/14/2018   HCT 37.9 12/14/2018   MCV 91 12/14/2018   PLT 280 12/14/2018    Lab  Results  Component Value Date   CREATININE 0.73 12/14/2018   BUN 15 12/14/2018   NA 140 12/14/2018   K 4.0 12/14/2018   CL 101 12/14/2018   CO2 23 12/14/2018    Lab Results  Component Value Date   ALT 15 12/14/2018   AST 17 12/14/2018   ALKPHOS 66 12/14/2018   BILITOT 0.3 12/14/2018    Lab Results  Component Value Date   CHOL 244 (H) 06/08/2018   HDL 109 06/08/2018   LDLCALC 123 (H) 06/08/2018   TRIG 58 06/08/2018   CHOLHDL 2.2 06/08/2018    Lab Results  Component Value Date   TSH 1.620 12/02/2017    Lab Results  Component Value Date   HGBA1C 5.5 12/02/2017     Assessment:   Well woman exam with routine gynecological exam  Dyslipidemia Post-menopause on HRT (hormone replacement therapy) Overweight  Osteopenia Family history of colon cancer  Plan:  Pap: Pap Co Test. Continue routine screening Mammogram: Ordered Stool Guaiac Testing:  Not Indicated. Up to date on colonoscopy. Due for repeat this year (per GI recommendations for family h/o colon cancer).   Labs: see orders Routine preventative health maintenance measures emphasized: Exercise/Diet/Weight control, Tobacco Warnings, Alcohol/Substance use risks, Stress Management and Safe Sex Menopausal syndrome.Naoko has a ~4 year history of HRT exposure.  Recommendation are for lowest dose for shortest period of time to manage symptoms. Osteopenia should be helped by HRT. Can refill when due.  Osteopenia - endourage Calcium and Vitamin D supplementation. Also may be helped by hormonal therapy. Can consider repeat in 1 year. COVID-19 vaccination series: Has completed vaccination series.   Return to [place], MD [facility]

## 2019-12-19 NOTE — Patient Instructions (Signed)
Preventive Care 40-54 Years Old, Female Preventive care refers to visits with your health care provider and lifestyle choices that can promote health and wellness. This includes:  A yearly physical exam. This may also be called an annual well check.  Regular dental visits and eye exams.  Immunizations.  Screening for certain conditions.  Healthy lifestyle choices, such as eating a healthy diet, getting regular exercise, not using drugs or products that contain nicotine and tobacco, and limiting alcohol use. What can I expect for my preventive care visit? Physical exam Your health care provider will check your:  Height and weight. This may be used to calculate body mass index (BMI), which tells if you are at a healthy weight.  Heart rate and blood pressure.  Skin for abnormal spots. Counseling Your health care provider may ask you questions about your:  Alcohol, tobacco, and drug use.  Emotional well-being.  Home and relationship well-being.  Sexual activity.  Eating habits.  Work and work environment.  Method of birth control.  Menstrual cycle.  Pregnancy history. What immunizations do I need?  Influenza (flu) vaccine  This is recommended every year. Tetanus, diphtheria, and pertussis (Tdap) vaccine  You may need a Td booster every 10 years. Varicella (chickenpox) vaccine  You may need this if you have not been vaccinated. Zoster (shingles) vaccine  You may need this after age 60. Measles, mumps, and rubella (MMR) vaccine  You may need at least one dose of MMR if you were born in 1957 or later. You may also need a second dose. Pneumococcal conjugate (PCV13) vaccine  You may need this if you have certain conditions and were not previously vaccinated. Pneumococcal polysaccharide (PPSV23) vaccine  You may need one or two doses if you smoke cigarettes or if you have certain conditions. Meningococcal conjugate (MenACWY) vaccine  You may need this if you  have certain conditions. Hepatitis A vaccine  You may need this if you have certain conditions or if you travel or work in places where you may be exposed to hepatitis A. Hepatitis B vaccine  You may need this if you have certain conditions or if you travel or work in places where you may be exposed to hepatitis B. Haemophilus influenzae type b (Hib) vaccine  You may need this if you have certain conditions. Human papillomavirus (HPV) vaccine  If recommended by your health care provider, you may need three doses over 6 months. You may receive vaccines as individual doses or as more than one vaccine together in one shot (combination vaccines). Talk with your health care provider about the risks and benefits of combination vaccines. What tests do I need? Blood tests  Lipid and cholesterol levels. These may be checked every 5 years, or more frequently if you are over 54 years old.  Hepatitis C test.  Hepatitis B test. Screening  Lung cancer screening. You may have this screening every year starting at age 54 if you have a 30-pack-year history of smoking and currently smoke or have quit within the past 15 years.  Colorectal cancer screening. All adults should have this screening starting at age 54 and continuing until age 75. Your health care provider may recommend screening at age 45 if you are at increased risk. You will have tests every 1-10 years, depending on your results and the type of screening test.  Diabetes screening. This is done by checking your blood sugar (glucose) after you have not eaten for a while (fasting). You may have this   done every 1-3 years.  Mammogram. This may be done every 1-2 years. Talk with your health care provider about when you should start having regular mammograms. This may depend on whether you have a family history of breast cancer.  BRCA-related cancer screening. This may be done if you have a family history of breast, ovarian, tubal, or peritoneal  cancers.  Pelvic exam and Pap test. This may be done every 3 years starting at age 54. Starting at age 54, this may be done every 5 years if you have a Pap test in combination with an HPV test. if you have a Pap test in combination with an HPV test. Other tests  Sexually transmitted disease (STD) testing.  Bone density scan. This is done to screen for osteoporosis. You may have this scan if you are at high risk for osteoporosis. Follow these instructions at home: Eating and drinking  Eat a diet that includes fresh fruits and vegetables, whole grains, lean protein, and low-fat dairy.  Take vitamin and mineral supplements as recommended by your health care provider.  Do not drink alcohol if: ? Your health care provider tells you not to drink. ? You are pregnant, may be pregnant, or are planning to become pregnant.  If you drink alcohol: ? Limit how much you have to 0-1 drink a day. ? Be aware of how much alcohol is in your drink. In the [place] one drink equals one 12 oz bottle of beer (355 mL), one 5 oz glass of wine (148 mL), or one 1 oz glass of hard liquor (44 mL). Lifestyle  Take daily care of your teeth and gums.  Stay active. Exercise for at least 30 minutes on 5 or more days each week.  Do not use any products that contain nicotine or tobacco, such as cigarettes, e-cigarettes, and chewing tobacco. If you need help quitting, ask your health care provider.  If you are sexually active, practice safe sex. Use a condom or other form of birth control (contraception) in order to prevent pregnancy and STIs (sexually transmitted infections).  If told by your health care provider, take low-dose aspirin daily starting at age 54. What's next?  Visit your health care provider once a year for a well check visit.  Ask your health care provider how often you should have your eyes and teeth checked.  Stay up to date on all vaccines. This information is not intended to replace advice given to you by your health care provider. Make sure you  discuss any questions you have with your health care provider. Document Revised: 12/15/2017 Document Reviewed: 12/15/2017 Elsevier Patient Education  2020 [place] Breast self-awareness is knowing how your breasts look and feel. Doing breast self-awareness is important. It allows you to catch a breast problem early while it is still small and can be treated. All women should do breast self-awareness, including women who have had breast implants. Tell your doctor if you notice a change in your breasts. What you need:  A mirror.  A well-lit room. How to do a breast self-exam A breast self-exam is one way to learn what is normal for your breasts and to check for changes. To do a breast self-exam: Look for changes  1. Take off all the clothes above your waist. 2. Stand in front of a mirror in a room with good lighting. 3. Put your hands on your hips. 4. Push your hands down. 5. Look at your breasts and nipples in the mirror to see if one breast or nipple looks different from the  other. Check to see if: ? The shape of one breast is different. ? The size of one breast is different. ? There are wrinkles, dips, and bumps in one breast and not the other. 6. Look at each breast for changes in the skin, such as: ? Redness. ? Scaly areas. 7. Look for changes in your nipples, such as: ? Liquid around the nipples. ? Bleeding. ? Dimpling. ? Redness. ? A change in where the nipples are. Feel for changes  1. Lie on your back on the floor. 2. Feel each breast. To do this, follow these steps: ? Pick a breast to feel. ? Put the arm closest to that breast above your head. ? Use your other arm to feel the nipple area of your breast. Feel the area with the pads of your three middle fingers by making small circles with your fingers. For the first circle, press lightly. For the second circle, press harder. For the third circle, press even harder. ? Keep making circles with  your fingers at the different pressures as you move down your breast. Stop when you feel your ribs. ? Move your fingers a little toward the center of your body. ? Start making circles with your fingers again, this time going up until you reach your collarbone. ? Keep making up-and-down circles until you reach your armpit. Remember to keep using the three pressures. ? Feel the other breast in the same way. 3. Sit or stand in the tub or shower. 4. With soapy water on your skin, feel each breast the same way you did in step 2 when you were lying on the floor. Write down what you find Writing down what you find can help you remember what to tell your doctor. Write down:  What is normal for each breast.  Any changes you find in each breast, including: ? The kind of changes you find. ? Whether you have pain. ? Size and location of any lumps.  When you last had your menstrual period. General tips  Check your breasts every month.  If you are breastfeeding, the best time to check your breasts is after you feed your baby or after you use a breast pump.  If you get menstrual periods, the best time to check your breasts is 5-7 days after your menstrual period is over.  With time, you will become comfortable with the self-exam, and you will begin to know if there are changes in your breasts. Contact a doctor if you:  See a change in the shape or size of your breasts or nipples.  See a change in the skin of your breast or nipples, such as red or scaly skin.  Have fluid coming from your nipples that is not normal.  Find a lump or thick area that was not there before.  Have pain in your breasts.  Have any concerns about your breast health. Summary  Breast self-awareness includes looking for changes in your breasts, as well as feeling for changes within your breasts.  Breast self-awareness should be done in front of a mirror in a well-lit room.  You should check your breasts every month.  If you get menstrual periods, the best time to check your breasts is 5-7 days after your menstrual period is over.  Let your doctor know of any changes you see in your breasts, including changes in size, changes on the skin, pain or tenderness, or fluid from your nipples that is not normal. This information is not  intended to replace advice given to you by your health care provider. Make sure you discuss any questions you have with your health care provider. Document Revised: 11/22/2017 Document Reviewed: 11/22/2017 Elsevier Patient Education  [place].

## 2019-12-20 LAB — COMPREHENSIVE METABOLIC PANEL
ALT: 21 IU/L (ref 0–32)
AST: 20 IU/L (ref 0–40)
Albumin/Globulin Ratio: 2 (ref 1.2–2.2)
Albumin: 4.7 g/dL (ref 3.8–4.9)
Alkaline Phosphatase: 66 IU/L (ref 48–121)
BUN/Creatinine Ratio: 15 (ref 9–23)
BUN: 9 mg/dL (ref 6–24)
Bilirubin Total: 0.3 mg/dL (ref 0.0–1.2)
CO2: 23 mmol/L (ref 20–29)
Calcium: 9.3 mg/dL (ref 8.7–10.2)
Chloride: 103 mmol/L (ref 96–106)
Creatinine, Ser: 0.6 mg/dL (ref 0.57–1.00)
GFR calc Af Amer: 120 mL/min/{1.73_m2} (ref >59)
GFR calc non Af Amer: 104 mL/min/{1.73_m2} (ref >59)
Globulin, Total: 2.3 g/dL (ref 1.5–4.5)
Glucose: 76 mg/dL (ref 65–99)
Potassium: 4.3 mmol/L (ref 3.5–5.2)
Sodium: 139 mmol/L (ref 134–144)
Total Protein: 7 g/dL (ref 6.0–8.5)

## 2019-12-20 LAB — TSH: TSH: 1.71 u[IU]/mL (ref 0.450–4.500)

## 2019-12-20 LAB — CBC
Hematocrit: 37.7 % (ref 34.0–46.6)
Hemoglobin: 12.9 g/dL (ref 11.1–15.9)
MCH: 30.4 pg (ref 26.6–33.0)
MCHC: 34.2 g/dL (ref 31.5–35.7)
MCV: 89 fL (ref 79–97)
Platelets: 295 10*3/uL (ref 150–450)
RBC: 4.25 x10E6/uL (ref 3.77–5.28)
RDW: 12 % (ref 11.7–15.4)
WBC: 4.7 10*3/uL (ref 3.4–10.8)

## 2019-12-20 LAB — LIPID PANEL
Chol/HDL Ratio: 2.3 ratio (ref 0.0–4.4)
Cholesterol, Total: 212 mg/dL — ABNORMAL HIGH (ref 100–199)
HDL: 94 mg/dL (ref >39)
LDL Chol Calc (NIH): 97 mg/dL (ref 0–99)
Triglycerides: 126 mg/dL (ref 0–149)
VLDL Cholesterol Cal: 21 mg/dL (ref 5–40)

## 2019-12-21 LAB — CYTOLOGY - PAP
Comment: NEGATIVE
Diagnosis: NEGATIVE
High risk HPV: NEGATIVE

## 2019-12-23 ENCOUNTER — Encounter: Payer: Self-pay | Admitting: Obstetrics and Gynecology

## 2019-12-26 ENCOUNTER — Other Ambulatory Visit: Payer: Self-pay

## 2019-12-26 ENCOUNTER — Telehealth (INDEPENDENT_AMBULATORY_CARE_PROVIDER_SITE_OTHER): Payer: Self-pay | Admitting: Gastroenterology

## 2019-12-26 DIAGNOSIS — Z8371 Family history of colonic polyps: Secondary | ICD-10-CM

## 2019-12-26 DIAGNOSIS — Z1211 Encounter for screening for malignant neoplasm of colon: Secondary | ICD-10-CM

## 2019-12-26 MED ORDER — PEG 3350-KCL-NA BICARB-NACL 420 G PO SOLR: 4000.0000 mL | Freq: Once | ORAL | 0 refills | Status: AC

## 2019-12-26 NOTE — Progress Notes (Signed)
Gastroenterology Pre-Procedure Review  Request Date: Tuesday 01/01/20 Requesting Physician: Dr. Allen Norris  PATIENT REVIEW QUESTIONS: The patient responded to the following health history questions as indicated:    1. Are you having any GI issues? no 2. Do you have a personal history of Polyps? no 3. Do you have a family history of Colon Cancer or Polyps? yes (maternal grandfather colon cancer, mother had colon polyps) 4. Diabetes Mellitus? no 5. Joint replacements in the past 12 months?no 6. Major health problems in the past 3 months?no 7. Any artificial heart valves, MVP, or defibrillator?no    MEDICATIONS & ALLERGIES:    Patient reports the following regarding taking any anticoagulation/antiplatelet therapy:   Plavix, Coumadin, Eliquis, Xarelto, Lovenox, Pradaxa, Brilinta, or Effient? no Aspirin? no  Patient confirms/reports the following medications:  Current Outpatient Medications  Medication Sig Dispense Refill  . fluticasone (FLONASE) 50 MCG/ACT nasal spray SHAKE LIQUID AND USE 1 SPRAY IN EACH NOSTRIL DAILY 16 g 2  . labetalol (NORMODYNE) 100 MG tablet TAKE 1 TABLET(100 MG) BY MOUTH DAILY 90 tablet 0  . loratadine (CLARITIN) 10 MG tablet Take 10 mg by mouth daily.    . Multiple Vitamins-Minerals (MULTIVITAMIN GUMMIES WOMENS PO) Take by mouth.    Marland Kitchen PREMPRO 0.625-5 MG tablet TAKE 1 TABLET BY MOUTH DAILY 90 tablet 2  . Melatonin-Pyridoxine (MELATIN PO) Take by mouth. (Patient not taking: Reported on 12/26/2019)    . polyethylene glycol-electrolytes (GAVILYTE-N WITH FLAVOR PACK) 420 g solution Take 4,000 mLs by mouth once for 1 dose. 4000 mL 0   No current facility-administered medications for this visit.    Patient confirms/reports the following allergies:  Allergies  Allergen Reactions  . Azithromycin   . Yaz [Drospirenone-Ethinyl Estradiol] Rash    No orders of the defined types were placed in this encounter.   AUTHORIZATION INFORMATION Primary Insurance: 1D#: Group  #:  Secondary Insurance: 1D#: Group #:  SCHEDULE INFORMATION: Date: Tuesday 01/01/20 Time: Location:[facility]

## 2019-12-28 ENCOUNTER — Other Ambulatory Visit: Payer: Self-pay

## 2019-12-28 ENCOUNTER — Other Ambulatory Visit
Admission: RE | Admit: 2019-12-28 | Discharge: 2019-12-28 | Disposition: A | Discharge status: Home / Self Care | Payer: BC Managed Care – PPO | Source: Ambulatory Visit | Attending: Gastroenterology | Admitting: Gastroenterology

## 2019-12-28 DIAGNOSIS — Z20822 Contact with and (suspected) exposure to covid-19: Secondary | ICD-10-CM | POA: Diagnosis not present

## 2019-12-28 DIAGNOSIS — Z01812 Encounter for preprocedural laboratory examination: Secondary | ICD-10-CM | POA: Diagnosis not present

## 2019-12-29 LAB — SARS CORONAVIRUS 2 (TAT 6-24 HRS): SARS Coronavirus 2: NEGATIVE

## 2020-01-01 ENCOUNTER — Other Ambulatory Visit: Payer: Self-pay

## 2020-01-01 ENCOUNTER — Ambulatory Visit
Admission: RE | Admit: 2020-01-01 | Discharge: 2020-01-01 | Disposition: A | Discharge status: Home / Self Care | Payer: BC Managed Care – PPO | Attending: Gastroenterology | Admitting: Gastroenterology

## 2020-01-01 ENCOUNTER — Encounter: Payer: Self-pay | Admitting: Gastroenterology

## 2020-01-01 ENCOUNTER — Ambulatory Visit: Payer: BC Managed Care – PPO | Admitting: Anesthesiology

## 2020-01-01 ENCOUNTER — Encounter
Admission: RE | Disposition: A | Discharge status: Home / Self Care | Payer: Self-pay | Source: Home / Self Care | Attending: Gastroenterology

## 2020-01-01 DIAGNOSIS — Z1211 Encounter for screening for malignant neoplasm of colon: Secondary | ICD-10-CM | POA: Diagnosis not present

## 2020-01-01 DIAGNOSIS — Z8249 Family history of ischemic heart disease and other diseases of the circulatory system: Secondary | ICD-10-CM | POA: Insufficient documentation

## 2020-01-01 DIAGNOSIS — I10 Essential (primary) hypertension: Secondary | ICD-10-CM | POA: Diagnosis not present

## 2020-01-01 DIAGNOSIS — Z79899 Other long term (current) drug therapy: Secondary | ICD-10-CM | POA: Insufficient documentation

## 2020-01-01 DIAGNOSIS — Z8379 Family history of other diseases of the digestive system: Secondary | ICD-10-CM | POA: Diagnosis not present

## 2020-01-01 DIAGNOSIS — Z888 Allergy status to other drugs, medicaments and biological substances status: Secondary | ICD-10-CM | POA: Insufficient documentation

## 2020-01-01 DIAGNOSIS — Z881 Allergy status to other antibiotic agents status: Secondary | ICD-10-CM | POA: Diagnosis not present

## 2020-01-01 DIAGNOSIS — Z79890 Hormone replacement therapy (postmenopausal): Secondary | ICD-10-CM | POA: Diagnosis not present

## 2020-01-01 DIAGNOSIS — J309 Allergic rhinitis, unspecified: Secondary | ICD-10-CM | POA: Insufficient documentation

## 2020-01-01 DIAGNOSIS — K641 Second degree hemorrhoids: Secondary | ICD-10-CM | POA: Insufficient documentation

## 2020-01-01 DIAGNOSIS — Z8371 Family history of colonic polyps: Secondary | ICD-10-CM

## 2020-01-01 HISTORY — PX: COLONOSCOPY WITH PROPOFOL: SHX5780

## 2020-01-01 SURGERY — COLONOSCOPY WITH PROPOFOL
Anesthesia: General

## 2020-01-01 MED ORDER — SODIUM CHLORIDE 0.9 % IV SOLN: INTRAVENOUS | Status: DC

## 2020-01-01 MED ORDER — PROPOFOL 10 MG/ML IV BOLUS
INTRAVENOUS | Status: DC | PRN
  Administered 2020-01-01: 100 mg via INTRAVENOUS

## 2020-01-01 MED ORDER — PROPOFOL 10 MG/ML IV BOLUS
INTRAVENOUS | Status: AC
  Filled 2020-01-01: qty 20

## 2020-01-01 MED ORDER — PROPOFOL 500 MG/50ML IV EMUL
INTRAVENOUS | Status: AC
  Filled 2020-01-01: qty 50

## 2020-01-01 MED ORDER — LIDOCAINE HCL (PF) 2 % IJ SOLN
INTRAMUSCULAR | Status: AC
  Filled 2020-01-01: qty 5

## 2020-01-01 MED ORDER — MIDAZOLAM HCL 2 MG/2ML IJ SOLN
INTRAMUSCULAR | Status: AC
  Filled 2020-01-01: qty 2

## 2020-01-01 MED ORDER — LIDOCAINE HCL (CARDIAC) PF 100 MG/5ML IV SOSY
PREFILLED_SYRINGE | INTRAVENOUS | Status: DC | PRN
  Administered 2020-01-01: 50 mg via INTRAVENOUS

## 2020-01-01 MED ORDER — PROPOFOL 500 MG/50ML IV EMUL
INTRAVENOUS | Status: DC | PRN
  Administered 2020-01-01: 120 ug/kg/min via INTRAVENOUS

## 2020-01-01 MED ORDER — MIDAZOLAM HCL 2 MG/2ML IJ SOLN
INTRAMUSCULAR | Status: DC | PRN
  Administered 2020-01-01: 2 mg via INTRAVENOUS

## 2020-01-01 NOTE — Anesthesia Preprocedure Evaluation (Signed)
Anesthesia Evaluation  Patient identified by MRN, date of birth, ID band Patient awake    Reviewed: Allergy & Precautions, H&P , NPO status , Patient's Chart, lab work & pertinent test results  History of Anesthesia Complications Negative for: history of anesthetic complications  Airway Mallampati: II  TM Distance: >3 FB Neck ROM: full    Dental  (+) Teeth Intact   Pulmonary neg pulmonary ROS, neg sleep apnea, neg COPD,    breath sounds clear to auscultation       Cardiovascular hypertension, (-) angina(-) Past MI and (-) Cardiac Stents (-) dysrhythmias  Rhythm:regular Rate:Normal     Neuro/Psych negative neurological ROS  negative psych ROS   GI/Hepatic negative GI ROS, Neg liver ROS,   Endo/Other  negative endocrine ROS  Renal/GU negative Renal ROS  negative genitourinary   Musculoskeletal   Abdominal   Peds  Hematology negative hematology ROS (+)   Anesthesia Other Findings Past Medical History: No date: AR (allergic rhinitis) No date: Hypertension No date: TMJ (dislocation of temporomandibular joint) No date: Vitamin D deficiency  Past Surgical History: No date: COLONOSCOPY  BMI    Body Mass Index: 28.01 kg/m      Reproductive/Obstetrics negative OB ROS                            Anesthesia Physical Anesthesia Plan  ASA: I  Anesthesia Plan: General   Post-op Pain Management:    Induction:   PONV Risk Score and Plan: Propofol infusion and TIVA  Airway Management Planned: Nasal Cannula  Additional Equipment:   Intra-op Plan:   Post-operative Plan:   Informed Consent: I have reviewed the patients History and Physical, chart, labs and discussed the procedure including the risks, benefits and alternatives for the proposed anesthesia with the patient or authorized representative who has indicated his/her understanding and acceptance.     Dental Advisory  Given  Plan Discussed with: Anesthesiologist, CRNA and Surgeon  Anesthesia Plan Comments:        Anesthesia Quick Evaluation

## 2020-01-01 NOTE — Transfer of Care (Signed)
Immediate Anesthesia Transfer of Care Note  Patient: Debra Haynes  Procedure(s) Performed: COLONOSCOPY WITH PROPOFOL (N/A )  Patient Location: Endoscopy Unit  Anesthesia Type:General  Level of Consciousness: drowsy and patient cooperative  Airway & Oxygen Therapy: Patient Spontanous Breathing  Post-op Assessment: Report given to RN and Post -op Vital signs reviewed and stable  Post vital signs: Reviewed and stable  Last Vitals:  Vitals Value Taken Time  BP 101/68 01/01/20 1121  Temp 36.7 C 01/01/20 1119  Pulse 99 01/01/20 1122  Resp 25 01/01/20 1122  SpO2 98 % 01/01/20 1122  Vitals shown include unvalidated device data.  Last Pain:  Vitals:   01/01/20 1119  TempSrc: Temporal  PainSc: 0-No pain         Complications: No complications documented.

## 2020-01-01 NOTE — Anesthesia Postprocedure Evaluation (Signed)
Anesthesia Post Note  Patient: Debra Haynes  Procedure(s) Performed: COLONOSCOPY WITH PROPOFOL (N/A )  Patient location during evaluation: PACU Anesthesia Type: General Level of consciousness: awake and alert Pain management: pain level controlled Vital Signs Assessment: post-procedure vital signs reviewed and stable Respiratory status: spontaneous breathing, nonlabored ventilation and respiratory function stable Cardiovascular status: blood pressure returned to baseline and stable Postop Assessment: no apparent nausea or vomiting Anesthetic complications: no   No complications documented.   Last Vitals:  Vitals:   01/01/20 1129 01/01/20 1139  BP: 123/69 135/80  Pulse:    Resp:    Temp:    SpO2:      Last Pain:  Vitals:   01/01/20 1149  TempSrc:   PainSc: 0-No pain                 Brett Canales Mariangela Heldt

## 2020-01-01 NOTE — H&P (Signed)
Lucilla Lame, MD [address] [place], [place] [address] Phone: [phone] Fax : [phone]  Primary Care Physician:  Rubie Maid, MD Primary Gastroenterologist:  Dr. Allen Norris  Pre-Procedure History & Physical: HPI:  Debra Haynes is a 54 y.o. female is here for a screening colonoscopy.   Past Medical History:  Diagnosis Date  . AR (allergic rhinitis)   . Hypertension   . TMJ (dislocation of temporomandibular joint)   . Vitamin D deficiency     Past Surgical History:  Procedure Laterality Date  . NO PAST SURGERIES      Prior to Admission medications   Medication Sig Start Date End Date Taking? Authorizing Provider  fluticasone (FLONASE) 50 MCG/ACT nasal spray SHAKE LIQUID AND USE 1 SPRAY IN EACH NOSTRIL DAILY 10/19/18   Rubie Maid, MD  labetalol (NORMODYNE) 100 MG tablet TAKE 1 TABLET(100 MG) BY MOUTH DAILY 11/15/19   Rubie Maid, MD  loratadine (CLARITIN) 10 MG tablet Take 10 mg by mouth daily.    [provider]  Melatonin-Pyridoxine (MELATIN PO) Take by mouth. Patient not taking: Reported on 12/26/2019    [provider]  Multiple Vitamins-Minerals (MULTIVITAMIN GUMMIES WOMENS PO) Take by mouth.    [provider]  PREMPRO 0.625-5 MG tablet TAKE 1 TABLET BY MOUTH DAILY 06/08/19   Rubie Maid, MD    Allergies as of 12/26/2019 - Review Complete 12/23/2019  Allergen Reaction Noted  . Azithromycin  10/12/2016  . Yaz [drospirenone-ethinyl estradiol] Rash 10/09/2015    Family History  Problem Relation Age of Onset  . Diabetes Father   . Hypertension Father   . Breast cancer Maternal Aunt   . Colon cancer Maternal Grandfather   . Hypertension Mother   . Hyperthyroidism Mother        Ablation   . Breast cancer Cousin 54       mat cousin  . Ovarian cancer Neg Hx     Social History   Socioeconomic History  . Marital status: Married    Spouse name: Not on file  . Number of children: 2  . Years of education: Not on  file  . Highest education level: Not on file  Occupational History  . Not on file  Tobacco Use  . Smoking status: Never Smoker  . Smokeless tobacco: Never Used  Vaping Use  . Vaping Use: Never used  Substance and Sexual Activity  . Alcohol use: Yes    Comment: 3 DAYS WEEK  . Drug use: No  . Sexual activity: Not Currently    Birth control/protection: None  Other Topics Concern  . Not on file  Social History Narrative   Recently moved here from [place], [place] (2017)   Husband is an Forensic psychologist   2 Adult Children    Social Determinants of Health   Financial Resource Strain:   . Difficulty of Paying Living Expenses: Not on file  Food Insecurity:   . Worried About Charity fundraiser in the Last Year: Not on file  . Ran Out of Food in the Last Year: Not on file  Transportation Needs:   . Lack of Transportation (Medical): Not on file  . Lack of Transportation (Non-Medical): Not on file  Physical Activity:   . Days of Exercise per Week: Not on file  . Minutes of Exercise per Session: Not on file  Stress:   . Feeling of Stress : Not on file  Social Connections:   . Frequency of Communication with Friends and  Family: Not on file  . Frequency of Social Gatherings with Friends and Family: Not on file  . Attends Religious Services: Not on file  . Active Member of Clubs or Organizations: Not on file  . Attends Archivist Meetings: Not on file  . Marital Status: Not on file  Intimate Partner Violence:   . Fear of Current or Ex-Partner: Not on file  . Emotionally Abused: Not on file  . Physically Abused: Not on file  . Sexually Abused: Not on file    Review of Systems: See HPI, otherwise negative ROS  Physical Exam: LMP 06/17/2017 (Exact Date)  General:   Alert,  pleasant and cooperative in NAD Head:  Normocephalic and atraumatic. Neck:  Supple; no masses or thyromegaly. Lungs:  Clear throughout to auscultation.    Heart:  Regular rate and rhythm. Abdomen:  Soft,  nontender and nondistended. Normal bowel sounds, without guarding, and without rebound.   Neurologic:  Alert and  oriented x4;  grossly normal neurologically.  Impression/Plan: Debra Haynes is now here to undergo a screening colonoscopy.  Risks, benefits, and alternatives regarding colonoscopy have been reviewed with the patient.  Questions have been answered.  All parties agreeable.

## 2020-01-01 NOTE — Op Note (Signed)
[facility]astroenter[facility] Patient Name: Debra Haynes Procedure Date: 01/01/2020 10:52 AM MRN: [id number] Account #: [id number] Date of Birth: [birth year] Admit Type: Outpatient Age: 54 Room: [facility] ENDO ROOM 1 Gender: Female Note Status: Finalized Procedure:             Colonoscopy Indications:           Screening for colorectal malignant neoplasm, Family                         history of colon cancer in a first-degree relative                         before age 49 years Providers:             Lucilla Lame MD, MD Referring MD:          [name] (Referring MD) Medicines:             Propofol per Anesthesia Complications:         No immediate complications. Procedure:             Pre-Anesthesia Assessment:                        - Prior to the procedure, a History and Physical was                         performed, and patient medications and allergies were                         reviewed. The patient's tolerance of previous                         anesthesia was also reviewed. The risks and benefits                         of the procedure and the sedation options and risks                         were discussed with the patient. All questions were                         answered, and informed consent was obtained. Prior                         Anticoagulants: The patient has taken no previous                         anticoagulant or antiplatelet agents. ASA Grade                         Assessment: II - A patient with mild systemic disease.                         After reviewing the risks and benefits, the patient                         was deemed in satisfactory condition to undergo the  procedure.                        After obtaining informed consent, the colonoscope was                         passed under direct vision. Throughout the procedure,                         the patient's blood pressure, pulse, and oxygen                          saturations were monitored continuously. The                         Colonoscope was introduced through the anus and                         advanced to the the cecum, identified by the                         appendiceal orifice, ileocecal valve and palpation.                         The colonoscopy was performed without difficulty. The                         patient tolerated the procedure well. The quality of                         the bowel preparation was excellent. Findings:      The perianal and digital rectal examinations were normal.      Non-bleeding internal hemorrhoids were found during retroflexion. The       hemorrhoids were Grade II (internal hemorrhoids that prolapse but reduce       spontaneously).      The exam was otherwise without abnormality. Impression:            - Non-bleeding internal hemorrhoids.                        - The examination was otherwise normal.                        - No specimens collected. Recommendation:        - Discharge patient to home.                        - Resume previous diet.                        - Continue present medications.                        - Repeat colonoscopy in 5 years for surveillance. Procedure Code(s):     --- Professional ---                        [phone], Colonoscopy, flexible; diagnostic, including  collection of specimen(s) by brushing or washing, when                         performed (separate procedure) Diagnosis Code(s):     --- Professional ---                        Z12.11, Encounter for screening for malignant neoplasm                         of colon CPT copyright 2019 [employer]. All rights reserved. The codes documented in this report are preliminary and upon coder review may  be revised to meet current compliance requirements. Lucilla Lame MD, MD 01/01/2020 11:17:38 AM This report has been signed electronically. Number of Addenda: 0 Note  Initiated On: 01/01/2020 10:52 AM Scope Withdrawal Time: 0 hours 6 minutes 4 seconds  Total Procedure Duration: 0 hours 17 minutes 5 seconds  Estimated Blood Loss:  Estimated blood loss: none.      [facility]

## 2020-01-30 ENCOUNTER — Encounter: Payer: Self-pay | Admitting: Dermatology

## 2020-01-30 ENCOUNTER — Other Ambulatory Visit: Payer: Self-pay

## 2020-01-30 ENCOUNTER — Ambulatory Visit (INDEPENDENT_AMBULATORY_CARE_PROVIDER_SITE_OTHER): Payer: BC Managed Care – PPO | Admitting: Dermatology

## 2020-01-30 DIAGNOSIS — L814 Other melanin hyperpigmentation: Secondary | ICD-10-CM | POA: Diagnosis not present

## 2020-01-30 DIAGNOSIS — D1800 Hemangioma unspecified site: Secondary | ICD-10-CM

## 2020-01-30 DIAGNOSIS — Z1283 Encounter for screening for malignant neoplasm of skin: Secondary | ICD-10-CM | POA: Diagnosis not present

## 2020-01-30 DIAGNOSIS — L821 Other seborrheic keratosis: Secondary | ICD-10-CM

## 2020-01-30 DIAGNOSIS — L820 Inflamed seborrheic keratosis: Secondary | ICD-10-CM | POA: Diagnosis not present

## 2020-01-30 DIAGNOSIS — D2239 Melanocytic nevi of other parts of face: Secondary | ICD-10-CM | POA: Diagnosis not present

## 2020-01-30 DIAGNOSIS — L813 Cafe au lait spots: Secondary | ICD-10-CM | POA: Diagnosis not present

## 2020-01-30 DIAGNOSIS — D489 Neoplasm of uncertain behavior, unspecified: Secondary | ICD-10-CM | POA: Diagnosis not present

## 2020-01-30 DIAGNOSIS — L578 Other skin changes due to chronic exposure to nonionizing radiation: Secondary | ICD-10-CM

## 2020-01-30 DIAGNOSIS — D229 Melanocytic nevi, unspecified: Secondary | ICD-10-CM

## 2020-01-30 NOTE — Progress Notes (Signed)
Follow-Up Visit   Subjective  Debra Haynes is a 54 y.o. female who presents for the following: FBSE.  Patient here for full body skin exam and skin cancer screening. She notes 3 bumps on her nose that get irritated when she is washing her face. Patient has no other concerns or history of skin cancer.  The following portions of the chart were reviewed this encounter and updated as appropriate:  Tobacco  Allergies  Meds  Problems  Med Hx  Surg Hx  Fam Hx      Review of Systems:  No other skin or systemic complaints except as noted in HPI or Assessment and Plan.  Objective  Well appearing patient in no apparent distress; mood and affect are within normal limits.  A full examination was performed including scalp, head, eyes, ears, nose, lips, neck, chest, axillae, abdomen, back, buttocks, bilateral upper extremities, bilateral lower extremities, hands, feet, fingers, toes, fingernails, and toenails. All findings within normal limits unless otherwise noted below.  Objective  Left Superior nasal ala: 0.2 cm pink papule  Objective  Left Inferior nasal ala: 0.2 cm pink papule  Objective  Right nasal supratip: 0.2 cm pink papule   Objective  Right  Posterior Thigh: Brown patch   Assessment & Plan  Neoplasm of uncertain behavior (3) Left Superior nasal ala  Skin / nail biopsy Type of biopsy: tangential   Informed consent: discussed and consent obtained   Timeout: patient name, date of birth, surgical site, and procedure verified   Procedure prep:  Patient was prepped and draped in usual sterile fashion Prep type:  Isopropyl alcohol Anesthesia: the lesion was anesthetized in a standard fashion   Anesthetic:  1% lidocaine w/ epinephrine 1-100,000 buffered w/ 8.4% NaHCO3 Instrument used: flexible razor blade   Hemostasis achieved with: aluminum chloride   Outcome: patient tolerated procedure well   Post-procedure details: sterile dressing applied and wound care  instructions given   Dressing type: bandage (mupirocin)    Specimen 1 - Surgical pathology Differential Diagnosis: R/O irritated angiofibroma vs other Check Margins: No 0.2 cm pink papule  Left Inferior nasal ala  Skin / nail biopsy Type of biopsy: tangential   Informed consent: discussed and consent obtained   Timeout: patient name, date of birth, surgical site, and procedure verified   Procedure prep:  Patient was prepped and draped in usual sterile fashion Prep type:  Isopropyl alcohol Anesthesia: the lesion was anesthetized in a standard fashion   Anesthetic:  1% lidocaine w/ epinephrine 1-100,000 buffered w/ 8.4% NaHCO3 Instrument used: flexible razor blade   Hemostasis achieved with: aluminum chloride   Outcome: patient tolerated procedure well   Post-procedure details: sterile dressing applied and wound care instructions given   Dressing type: bandage (mupirocin)    Specimen 2 - Surgical pathology Differential Diagnosis: R/O irritated angiofibroma vs other Check Margins: No 0.2 cm pink papule  Right nasal supratip  Skin / nail biopsy Type of biopsy: tangential   Informed consent: discussed and consent obtained   Timeout: patient name, date of birth, surgical site, and procedure verified   Procedure prep:  Patient was prepped and draped in usual sterile fashion Prep type:  Isopropyl alcohol Anesthesia: the lesion was anesthetized in a standard fashion   Anesthetic:  1% lidocaine w/ epinephrine 1-100,000 buffered w/ 8.4% NaHCO3 Instrument used: flexible razor blade   Hemostasis achieved with: aluminum chloride   Outcome: patient tolerated procedure well   Post-procedure details: sterile dressing applied and wound care instructions  given   Dressing type: bandage (mupirocin)    Specimen 3 - Surgical pathology Differential Diagnosis: R/O irritated angiofibroma vs other Check Margins: No 0.2 cm pink papule  Cafe au lait spots Right  Posterior  Thigh  Benign-appearing.  Observation.  Call clinic for new or changing lesions.  Recommend daily use of broad spectrum spf 30+ sunscreen to sun-exposed areas.   Lentigines - Scattered tan macules - Discussed due to sun exposure - Benign, observe - Call for any changes  Seborrheic Keratoses - Stuck-on, waxy, tan-brown papules and plaques  - Discussed benign etiology and prognosis. - Observe - Call for any changes  Melanocytic Nevi - Tan-brown and/or pink-flesh-colored symmetric macules and papules - Benign appearing on exam today - Observation - Call clinic for new or changing moles - Recommend daily use of broad spectrum spf 30+ sunscreen to sun-exposed areas.   Hemangiomas - Red papules - Discussed benign nature - Observe - Call for any changes  Actinic Damage - diffuse scaly erythematous macules with underlying dyspigmentation - Recommend daily broad spectrum sunscreen SPF 30+ to sun-exposed areas, reapply every 2 hours as needed.  - Call for new or changing lesions.  Skin cancer screening performed today.   Return in about 1 year (around 01/29/2021) for TBSE.  I, Donzetta Kohut, CMA, am acting as scribe for Forest Gleason, MD .  Documentation: I have reviewed the above documentation for accuracy and completeness, and I agree with the above.  Forest Gleason, MD

## 2020-01-30 NOTE — Patient Instructions (Addendum)

## 2020-02-06 NOTE — Progress Notes (Signed)
1. Skin , left superior nasal ala SEBORRHEIC KERATOSIS, IRRITATED  This is a benign growth or "wisdom spot". No additional treatment is needed.   2. Skin , left inferior nasal ala FIBROUS PAPULE  This is a benign growth common on the nose. No additional treatment is needed.  3. Skin , right nasal supratip SEBORRHEIC KERATOSIS, EARLY  This is a benign growth or "wisdom spot". No additional treatment is needed.

## 2020-02-10 ENCOUNTER — Other Ambulatory Visit: Payer: Self-pay | Admitting: Obstetrics and Gynecology

## 2020-02-12 ENCOUNTER — Other Ambulatory Visit: Payer: Self-pay

## 2020-02-12 ENCOUNTER — Ambulatory Visit
Admission: RE | Admit: 2020-02-12 | Discharge: 2020-02-12 | Disposition: A | Discharge status: Home / Self Care | Payer: BC Managed Care – PPO | Source: Ambulatory Visit | Attending: Obstetrics and Gynecology | Admitting: Obstetrics and Gynecology

## 2020-02-12 DIAGNOSIS — Z01419 Encounter for gynecological examination (general) (routine) without abnormal findings: Secondary | ICD-10-CM | POA: Diagnosis not present

## 2020-02-12 DIAGNOSIS — Z1231 Encounter for screening mammogram for malignant neoplasm of breast: Secondary | ICD-10-CM | POA: Diagnosis not present

## 2020-02-17 ENCOUNTER — Other Ambulatory Visit: Payer: Self-pay | Admitting: Obstetrics and Gynecology

## 2020-03-26 DIAGNOSIS — Z20822 Contact with and (suspected) exposure to covid-19: Secondary | ICD-10-CM | POA: Diagnosis not present

## 2020-05-22 DIAGNOSIS — H40039 Anatomical narrow angle, unspecified eye: Secondary | ICD-10-CM | POA: Diagnosis not present

## 2020-06-27 DIAGNOSIS — H40039 Anatomical narrow angle, unspecified eye: Secondary | ICD-10-CM | POA: Diagnosis not present

## 2020-07-10 DIAGNOSIS — H40039 Anatomical narrow angle, unspecified eye: Secondary | ICD-10-CM | POA: Diagnosis not present

## 2020-07-18 DIAGNOSIS — H40033 Anatomical narrow angle, bilateral: Secondary | ICD-10-CM | POA: Diagnosis not present

## 2020-08-29 ENCOUNTER — Encounter: Payer: Self-pay | Admitting: Obstetrics and Gynecology

## 2020-12-19 ENCOUNTER — Encounter: Payer: BC Managed Care – PPO | Admitting: Obstetrics and Gynecology

## 2020-12-23 ENCOUNTER — Other Ambulatory Visit: Payer: Self-pay

## 2020-12-23 ENCOUNTER — Encounter: Payer: Self-pay | Admitting: Obstetrics and Gynecology

## 2020-12-23 ENCOUNTER — Ambulatory Visit (INDEPENDENT_AMBULATORY_CARE_PROVIDER_SITE_OTHER): Payer: BC Managed Care – PPO | Admitting: Obstetrics and Gynecology

## 2020-12-23 VITALS — BP 142/82 | HR 71 | Ht <= 58 in | Wt 137.5 lb

## 2020-12-23 DIAGNOSIS — E663 Overweight: Secondary | ICD-10-CM

## 2020-12-23 DIAGNOSIS — E789 Disorder of lipoprotein metabolism, unspecified: Secondary | ICD-10-CM

## 2020-12-23 DIAGNOSIS — Z1231 Encounter for screening mammogram for malignant neoplasm of breast: Secondary | ICD-10-CM | POA: Diagnosis not present

## 2020-12-23 DIAGNOSIS — E785 Hyperlipidemia, unspecified: Secondary | ICD-10-CM

## 2020-12-23 DIAGNOSIS — I10 Essential (primary) hypertension: Secondary | ICD-10-CM

## 2020-12-23 DIAGNOSIS — Z800 Family history of malignant neoplasm of digestive organs: Secondary | ICD-10-CM

## 2020-12-23 DIAGNOSIS — Z01419 Encounter for gynecological examination (general) (routine) without abnormal findings: Secondary | ICD-10-CM | POA: Diagnosis not present

## 2020-12-23 DIAGNOSIS — Z1159 Encounter for screening for other viral diseases: Secondary | ICD-10-CM

## 2020-12-23 DIAGNOSIS — Z79890 Hormone replacement therapy (postmenopausal): Secondary | ICD-10-CM

## 2020-12-23 NOTE — Progress Notes (Addendum)
ANNUAL PREVENTATIVE CARE GYNECOLOGY  ENCOUNTER NOTE  Subjective:       Debra Haynes is a 55 y.o. G8P2002 married female here for a routine annual gynecologic exam.  The patient is taking hormone replacement therapy (has been using x ~ 4-5 years). Patient denies post-menopausal vaginal bleeding. The patient wears seatbelts: yes.   Current complaints: 1.  None.  Overall doing well, no complaints. Reports she is trying to adopt an overall healthier lifestyle.    Gynecologic History Patient's last menstrual period was 06/17/2017 (exact date). Contraception: post menopausal status Last Pap: 12/19/2019. Results were: normal Last mammogram: 02/12/2020. Results were: normal Last Colonoscopy: 01/01/2020 Repeat colonoscopy in 5 years due to family history.  Last Dexa Scan: 10/26/2016. Results were: osteopenia, T score -1.6.    Obstetric History OB History  Gravida Para Term Preterm AB Living  '2 2 2     2  '$ SAB IAB Ectopic Multiple Live Births          2    # Outcome Date GA Lbr Len/2nd Weight Sex Delivery Anes PTL Lv  2 Term 1997   8 lb 8 oz (3.856 kg) M Vag-Spont   LIV  1 Term 1994   7 lb 9.6 oz (3.447 kg) M Vag-Spont   LIV    Past Medical History:  Diagnosis Date   AR (allergic rhinitis)    Hypertension    TMJ (dislocation of temporomandibular joint)    Vitamin D deficiency     Family History  Problem Relation Age of Onset   Diabetes Father    Hypertension Father    Breast cancer Maternal Aunt    Colon cancer Maternal Grandfather    Hypertension Mother    Hyperthyroidism Mother        Ablation    Breast cancer Cousin 42       mat cousin   Ovarian cancer Neg Hx     Past Surgical History:  Procedure Laterality Date   COLONOSCOPY     COLONOSCOPY WITH PROPOFOL N/A 01/01/2020   Procedure: COLONOSCOPY WITH PROPOFOL;  Surgeon: Lucilla Lame, MD;  Location: [facility] ENDOSCOPY;  Service: Endoscopy;  Laterality: N/A;    Social History   Socioeconomic History   Marital status:  Married    Spouse name: Not on file   Number of children: 2   Years of education: Not on file   Highest education level: Not on file  Occupational History   Not on file  Tobacco Use   Smoking status: Never   Smokeless tobacco: Never  Vaping Use   Vaping Use: Never used  Substance and Sexual Activity   Alcohol use: Yes    Alcohol/week: 3.0 standard drinks    Types: 3 Glasses of wine per week    Comment: 3 DAYS WEEK   Drug use: No   Sexual activity: Not Currently    Birth control/protection: None  Other Topics Concern   Not on file  Social History Narrative   Recently moved here from [place], [place] (2017)   Husband is an Forensic psychologist   2 Adult Children    Social Determinants of Health   Financial Resource Strain: Not on file  Food Insecurity: Not on file  Transportation Needs: Not on file  Physical Activity: Not on file  Stress: Not on file  Social Connections: Not on file  Intimate Partner Violence: Not on file    Current Outpatient Medications on File Prior to Visit  Medication Sig Dispense Refill   fluticasone (  FLONASE) 50 MCG/ACT nasal spray SHAKE LIQUID AND USE 1 SPRAY IN EACH NOSTRIL DAILY 16 g 2   labetalol (NORMODYNE) 100 MG tablet TAKE 1 TABLET(100 MG) BY MOUTH DAILY 90 tablet 3   loratadine (CLARITIN) 10 MG tablet Take 10 mg by mouth daily.     Melatonin-Pyridoxine (MELATIN PO) Take by mouth.      Multiple Vitamins-Minerals (MULTIVITAMIN GUMMIES WOMENS PO) Take by mouth.     PREMPRO 0.625-5 MG tablet TAKE 1 TABLET BY MOUTH DAILY 84 tablet 3   No current facility-administered medications on file prior to visit.    Allergies  Allergen Reactions   Azithromycin    Yaz [Drospirenone-Ethinyl Estradiol] Rash      Review of Systems General ROS: negative for - chills, fatigue, fever, hot flashes, night sweats, weight gain or weight loss Psychological ROS: negative for - anxiety, decreased libido, depression, mood swings, physical abuse or sexual  abuse Ophthalmic ROS: negative for - blurry vision, eye pain or loss of vision ENT ROS: negative for - headaches, hearing change, visual changes or vocal changes Allergy and Immunology ROS: negative for - hives, itchy/watery eyes or seasonal allergies Hematological and Lymphatic ROS: negative for - bleeding problems, bruising, swollen lymph nodes or weight loss Endocrine ROS: negative for - galactorrhea, hair pattern changes, hot flashes, malaise/lethargy, mood swings, palpitations, polydipsia/polyuria, skin changes, temperature intolerance or unexpected weight changes Breast ROS: negative for - new or changing breast lumps or nipple discharge Respiratory ROS: negative for - cough or shortness of breath Cardiovascular ROS: negative for - chest pain, irregular heartbeat, palpitations or shortness of breath Gastrointestinal ROS: no abdominal pain, change in bowel habits, or black or bloody stools Genito-Urinary ROS: no dysuria, trouble voiding, or hematuria Musculoskeletal ROS: negative for - joint pain or joint stiffness Neurological ROS: negative for - bowel and bladder control changes Dermatological ROS: negative for rash and skin lesion changes   Objective:   BP (!) 142/82 (BP Location: Left Arm, Patient Position: Sitting, Cuff Size: Normal)   Pulse 71   Ht '4\' 10"'$  (1.473 m)   Wt 137 lb 8 oz (62.4 kg)   LMP 06/17/2017 (Exact Date)   BMI 28.74 kg/m  CONSTITUTIONAL: Well-developed, well-nourished female in no acute distress. Overweight.   PSYCHIATRIC: Normal mood and affect. Normal behavior. Normal judgment and thought content. [place]: Alert and oriented to person, place, and time. Normal muscle tone coordination. No cranial nerve deficit noted. HENT:  Normocephalic, atraumatic, External right and left ear normal. Oropharynx is clear and moist EYES: Conjunctivae and EOM are normal. Pupils are equal, round, and reactive to light. No scleral icterus.  NECK: Normal range of motion,  supple, no masses.  Normal thyroid.  SKIN: Skin is warm and dry. No rash noted. Not diaphoretic. No erythema. No pallor. CARDIOVASCULAR: Normal heart rate noted, regular rhythm, no murmur. RESPIRATORY: Clear to auscultation bilaterally. Effort and breath sounds normal, no problems with respiration noted. BREASTS: Symmetric in size. No masses, skin changes, nipple drainage, or lymphadenopathy. ABDOMEN: Soft, normal bowel sounds, no distention noted.  No tenderness, rebound or guarding.  BLADDER: Normal PELVIC:  Bladder no bladder distension noted  Urethra: normal appearing urethra with no masses, tenderness or lesions  Vulva: normal appearing vulva with no masses, tenderness or lesions  Vagina: normal appearing vagina with normal color and discharge, no lesions  Cervix: normal appearing cervix without discharge or lesions  Uterus: uterus is normal size, shape, consistency and nontender  Adnexa: normal adnexa in size, nontender and no  masses  RV: External Exam NormaI, No Rectal Masses and Normal Sphincter tone  MUSCULOSKELETAL: Normal range of motion. No tenderness.  No cyanosis, clubbing, or edema.  2+ distal pulses. LYMPHATIC: No Axillary, Supraclavicular, or Inguinal Adenopathy.   Labs: Lab Results  Component Value Date   WBC 4.7 12/19/2019   HGB 12.9 12/19/2019   HCT 37.7 12/19/2019   MCV 89 12/19/2019   PLT 295 12/19/2019    Lab Results  Component Value Date   CREATININE 0.60 12/19/2019   BUN 9 12/19/2019   NA 139 12/19/2019   K 4.3 12/19/2019   CL 103 12/19/2019   CO2 23 12/19/2019    Lab Results  Component Value Date   ALT 21 12/19/2019   AST 20 12/19/2019   ALKPHOS 66 12/19/2019   BILITOT 0.3 12/19/2019    Lab Results  Component Value Date   CHOL 212 (H) 12/19/2019   HDL 94 12/19/2019   LDLCALC 97 12/19/2019   TRIG 126 12/19/2019   CHOLHDL 2.3 12/19/2019    Lab Results  Component Value Date   TSH 1.710 12/19/2019    Lab Results  Component Value  Date   HGBA1C 5.5 12/02/2017     Assessment:   Well woman exam with routine gynecological exam  Dyslipidemia Post-menopause on HRT (hormone replacement therapy) Overweight  Osteopenia Family history of colon cancer Hypertension  Plan:  - Pap: up to date.  Continue routine screening. Can screen every 5 years.  - Mammogram: Ordered - Stool Guaiac Testing:  Not Indicated. Up to date on colonoscopy. 01/01/2020 up to date (per GI recommendations for family h/o colon cancer).   - Labs: TSH, Lipid panel, CBC, CMET and Hepatitis C.  - Routine preventative health maintenance measures emphasized: Exercise/Diet/Weight control, Tobacco Warnings, Alcohol/Substance use risks, Stress Management and Safe Sex - Menopausal syndrome.Debra Haynes has a ~ 4-5 year history of HRT exposure.  Recommendation are for lowest dose for shortest period of time to manage symptoms. Osteopenia should be helped by HRT. Can refill when due. Can discuss possibility of weaning next year.  - Osteopenia -  Continue to encourage Calcium and Vitamin D supplementation. Also may be helped by hormonal therapy. Can consider repeat Dexa Scan in 1 year. - COVID-19 vaccination series: Has completed vaccination series.  Also notes that she has had both boosters.  - Hypertension- currently on Labetalol. Doing well.  - Dyslipdemia mild, with slightly elevated total cholesterol. Overall cardiovascular risk still normal.   Return to [place], MD [facility]

## 2020-12-23 NOTE — Patient Instructions (Signed)
Breast Self-Awareness Breast self-awareness is knowing how your breasts look and feel. Doing breast self-awareness is important. It allows you to catch a breast problem early while it is still small and can be treated. All women should do breast self-awareness, including women who have had breast implants. Tell your doctor if you notice a change in your breasts. What you need: A mirror. A well-lit room. How to do a breast self-exam A breast self-exam is one way to learn what is normal for your breasts and to check for changes. To do a breast self-exam: Look for changes  Take off all the clothes above your waist. Stand in front of a mirror in a room with good lighting. Put your hands on your hips. Push your hands down. Look at your breasts and nipples in the mirror to see if one breast or nipple looks different from the other. Check to see if: The shape of one breast is different. The size of one breast is different. There are wrinkles, dips, and bumps in one breast and not the other. Look at each breast for changes in the skin, such as: Redness. Scaly areas. Look for changes in your nipples, such as: Liquid around the nipples. Bleeding. Dimpling. Redness. A change in where the nipples are. Feel for changes  Lie on your back on the floor. Feel each breast. To do this, follow these steps: Pick a breast to feel. Put the arm closest to that breast above your head. Use your other arm to feel the nipple area of your breast. Feel the area with the pads of your three middle fingers by making small circles with your fingers. For the first circle, press lightly. For the second circle, press harder. For the third circle, press even harder. Keep making circles with your fingers at the different pressures as you move down your breast. Stop when you feel your ribs. Move your fingers a little toward the center of your body. Start making circles with your fingers again, this time going up until  you reach your collarbone. Keep making up-and-down circles until you reach your armpit. Remember to keep using the three pressures. Feel the other breast in the same way. Sit or stand in the tub or shower. With soapy water on your skin, feel each breast the same way you did in step 2 when you were lying on the floor. Write down what you find Writing down what you find can help you remember what to tell your doctor. Write down: What is normal for each breast. Any changes you find in each breast, including: The kind of changes you find. Whether you have pain. Size and location of any lumps. When you last had your menstrual period. General tips Check your breasts every month. If you are breastfeeding, the best time to check your breasts is after you feed your baby or after you use a breast pump. If you get menstrual periods, the best time to check your breasts is 5-7 days after your menstrual period is over. With time, you will become comfortable with the self-exam, and you will begin to know if there are changes in your breasts. Contact a doctor if you: See a change in the shape or size of your breasts or nipples. See a change in the skin of your breast or nipples, such as red or scaly skin. Have fluid coming from your nipples that is not normal. Find a lump or thick area that was not there before. Have pain in   your breasts. Have any concerns about your breast health. Summary Breast self-awareness includes looking for changes in your breasts, as well as feeling for changes within your breasts. Breast self-awareness should be done in front of a mirror in a well-lit room. You should check your breasts every month. If you get menstrual periods, the best time to check your breasts is 5-7 days after your menstrual period is over. Let your doctor know of any changes you see in your breasts, including changes in size, changes on the skin, pain or tenderness, or fluid from your nipples that is not  normal. This information is not intended to replace advice given to you by your health care provider. Make sure you discuss any questions you have with your health care provider. Document Revised: 11/22/2017 Document Reviewed: 11/22/2017 Elsevier Patient Education  2022 Elsevier Inc.    Preventive Care 40-64 Years Old, Female Preventive care refers to lifestyle choices and visits with your health care provider that can promote health and wellness. This includes: A yearly physical exam. This is also called an annual wellness visit. Regular dental and eye exams. Immunizations. Screening for certain conditions. Healthy lifestyle choices, such as: Eating a healthy diet. Getting regular exercise. Not using drugs or products that contain nicotine and tobacco. Limiting alcohol use. What can I expect for my preventive care visit? Physical exam Your health care provider will check your: Height and weight. These may be used to calculate your BMI (body mass index). BMI is a measurement that tells if you are at a healthy weight. Heart rate and blood pressure. Body temperature. Skin for abnormal spots. Counseling Your health care provider may ask you questions about your: Past medical problems. Family's medical history. Alcohol, tobacco, and drug use. Emotional well-being. Home life and relationship well-being. Sexual activity. Diet, exercise, and sleep habits. Work and work environment. Access to firearms. Method of birth control. Menstrual cycle. Pregnancy history. What immunizations do I need? Vaccines are usually given at various ages, according to a schedule. Your health care provider will recommend vaccines for you based on your age, medical history, and lifestyle or other factors, such as travel or where you work. What tests do I need? Blood tests Lipid and cholesterol levels. These may be checked every 5 years, or more often if you are over 50 years old. Hepatitis C  test. Hepatitis B test. Screening Lung cancer screening. You may have this screening every year starting at age 55 if you have a 30-pack-year history of smoking and currently smoke or have quit within the past 15 years. Colorectal cancer screening. All adults should have this screening starting at age 50 and continuing until age 75. Your health care provider may recommend screening at age 45 if you are at increased risk. You will have tests every 1-10 years, depending on your results and the type of screening test. Diabetes screening. This is done by checking your blood sugar (glucose) after you have not eaten for a while (fasting). You may have this done every 1-3 years. Mammogram. This may be done every 1-2 years. Talk with your health care provider about when you should start having regular mammograms. This may depend on whether you have a family history of breast cancer. BRCA-related cancer screening. This may be done if you have a family history of breast, ovarian, tubal, or peritoneal cancers. Pelvic exam and Pap test. This may be done every 3 years starting at age 21. Starting at age 30, this may be done every   every 5 years if you have a Pap test in combination with an HPV test. Other tests STD (sexually transmitted disease) testing, if you are at risk. Bone density scan. This is done to screen for osteoporosis. You may have this scan if you are at high risk for osteoporosis. Talk with your health care provider about your test results, treatment options, and if necessary, the need for more tests. Follow these instructions at home: Eating and drinking  Eat a diet that includes fresh fruits and vegetables, whole grains, lean protein, and low-fat dairy products. Take vitamin and mineral supplements as recommended by your health care provider. Do not drink alcohol if: Your health care provider tells you not to drink. You are pregnant, may be pregnant, or are planning to become pregnant. If  you drink alcohol: Limit how much you have to 0-1 drink a day. Be aware of how much alcohol is in your drink. In the [place] one drink equals one 12 oz bottle of beer (355 mL), one 5 oz glass of wine (148 mL), or one 1 oz glass of hard liquor (44 mL). Lifestyle Take daily care of your teeth and gums. Brush your teeth every morning and night with fluoride toothpaste. Floss one time each day. Stay active. Exercise for at least 30 minutes 5 or more days each week. Do not use any products that contain nicotine or tobacco, such as cigarettes, e-cigarettes, and chewing tobacco. If you need help quitting, ask your health care provider. Do not use drugs. If you are sexually active, practice safe sex. Use a condom or other form of protection to prevent STIs (sexually transmitted infections). If you do not wish to become pregnant, use a form of birth control. If you plan to become pregnant, see your health care provider for a prepregnancy visit. If told by your health care provider, take low-dose aspirin daily starting at age 54. Find healthy ways to cope with stress, such as: Meditation, yoga, or listening to music. Journaling. Talking to a trusted person. Spending time with friends and family. Safety Always wear your seat belt while driving or riding in a vehicle. Do not drive: If you have been drinking alcohol. Do not ride with someone who has been drinking. When you are tired or distracted. While texting. Wear a helmet and other protective equipment during sports activities. If you have firearms in your house, make sure you follow all gun safety procedures. What's next? Visit your health care provider once a year for an annual wellness visit. Ask your health care provider how often you should have your eyes and teeth checked. Stay up to date on all vaccines. This information is not intended to replace advice given to you by your health care provider. Make sure you discuss any questions you have  with your health care provider. Document Revised: 06/13/2020 Document Reviewed: 12/15/2017 Elsevier Patient Education  2022 [employer].

## 2020-12-24 LAB — LIPID PANEL
Chol/HDL Ratio: 2.5 ratio (ref 0.0–4.4)
Cholesterol, Total: 224 mg/dL — ABNORMAL HIGH (ref 100–199)
HDL: 88 mg/dL (ref >39)
LDL Chol Calc (NIH): 119 mg/dL — ABNORMAL HIGH (ref 0–99)
Triglycerides: 99 mg/dL (ref 0–149)
VLDL Cholesterol Cal: 17 mg/dL (ref 5–40)

## 2020-12-24 LAB — CBC
Hematocrit: 40.9 % (ref 34.0–46.6)
Hemoglobin: 13.5 g/dL (ref 11.1–15.9)
MCH: 30.5 pg (ref 26.6–33.0)
MCHC: 33 g/dL (ref 31.5–35.7)
MCV: 93 fL (ref 79–97)
Platelets: 300 10*3/uL (ref 150–450)
RBC: 4.42 x10E6/uL (ref 3.77–5.28)
RDW: 11.9 % (ref 11.7–15.4)
WBC: 4.7 10*3/uL (ref 3.4–10.8)

## 2020-12-24 LAB — COMPREHENSIVE METABOLIC PANEL
ALT: 17 IU/L (ref 0–32)
AST: 20 IU/L (ref 0–40)
Albumin/Globulin Ratio: 2.2 (ref 1.2–2.2)
Albumin: 4.8 g/dL (ref 3.8–4.9)
Alkaline Phosphatase: 62 IU/L (ref 44–121)
BUN/Creatinine Ratio: 18 (ref 9–23)
BUN: 13 mg/dL (ref 6–24)
Bilirubin Total: 0.4 mg/dL (ref 0.0–1.2)
CO2: 21 mmol/L (ref 20–29)
Calcium: 9.5 mg/dL (ref 8.7–10.2)
Chloride: 103 mmol/L (ref 96–106)
Creatinine, Ser: 0.74 mg/dL (ref 0.57–1.00)
Globulin, Total: 2.2 g/dL (ref 1.5–4.5)
Glucose: 84 mg/dL (ref 65–99)
Potassium: 4.2 mmol/L (ref 3.5–5.2)
Sodium: 141 mmol/L (ref 134–144)
Total Protein: 7 g/dL (ref 6.0–8.5)
eGFR: 95 mL/min/{1.73_m2} (ref >59)

## 2020-12-24 LAB — HEPATITIS C ANTIBODY: Hep C Virus Ab: <0.1 s/co ratio (ref 0.0–0.9)

## 2020-12-24 LAB — TSH: TSH: 1.63 u[IU]/mL (ref 0.450–4.500)

## 2021-01-29 ENCOUNTER — Encounter: Payer: BC Managed Care – PPO | Admitting: Dermatology

## 2021-02-18 ENCOUNTER — Other Ambulatory Visit: Payer: Self-pay | Admitting: Obstetrics and Gynecology

## 2021-03-02 ENCOUNTER — Other Ambulatory Visit: Payer: Self-pay

## 2021-03-02 ENCOUNTER — Ambulatory Visit
Admission: RE | Admit: 2021-03-02 | Discharge: 2021-03-02 | Disposition: A | Discharge status: Home / Self Care | Payer: BC Managed Care – PPO | Source: Ambulatory Visit | Attending: Obstetrics and Gynecology | Admitting: Obstetrics and Gynecology

## 2021-03-02 DIAGNOSIS — Z1231 Encounter for screening mammogram for malignant neoplasm of breast: Secondary | ICD-10-CM | POA: Diagnosis not present

## 2021-03-02 DIAGNOSIS — Z01419 Encounter for gynecological examination (general) (routine) without abnormal findings: Secondary | ICD-10-CM

## 2021-03-22 ENCOUNTER — Other Ambulatory Visit: Payer: Self-pay | Admitting: Obstetrics and Gynecology

## 2021-05-28 ENCOUNTER — Encounter: Payer: Self-pay | Admitting: Dermatology

## 2021-05-28 ENCOUNTER — Other Ambulatory Visit: Payer: Self-pay

## 2021-05-28 ENCOUNTER — Ambulatory Visit (INDEPENDENT_AMBULATORY_CARE_PROVIDER_SITE_OTHER): Payer: BC Managed Care – PPO | Admitting: Dermatology

## 2021-05-28 DIAGNOSIS — L988 Other specified disorders of the skin and subcutaneous tissue: Secondary | ICD-10-CM

## 2021-05-28 DIAGNOSIS — L813 Cafe au lait spots: Secondary | ICD-10-CM

## 2021-05-28 DIAGNOSIS — D1800 Hemangioma unspecified site: Secondary | ICD-10-CM

## 2021-05-28 DIAGNOSIS — D229 Melanocytic nevi, unspecified: Secondary | ICD-10-CM

## 2021-05-28 DIAGNOSIS — L814 Other melanin hyperpigmentation: Secondary | ICD-10-CM

## 2021-05-28 DIAGNOSIS — L578 Other skin changes due to chronic exposure to nonionizing radiation: Secondary | ICD-10-CM

## 2021-05-28 DIAGNOSIS — L821 Other seborrheic keratosis: Secondary | ICD-10-CM

## 2021-05-28 DIAGNOSIS — Z1283 Encounter for screening for malignant neoplasm of skin: Secondary | ICD-10-CM | POA: Diagnosis not present

## 2021-05-28 NOTE — Progress Notes (Signed)
Follow-Up Visit   Subjective  Debra Haynes is a 56 y.o. female who presents for the following: Annual Exam (Here for skin cancer screening. Full body. No hx of DN or personal hx of skin cancer).  The patient presents for Total-Body Skin Exam (TBSE) for skin cancer screening and mole check.  The patient has spots, moles and lesions to be evaluated, some may be new or changing and the patient has concerns that these could be cancer.  The following portions of the chart were reviewed this encounter and updated as appropriate:  Tobacco   Allergies   Meds   Problems   Med Hx   Surg Hx   Fam Hx       Review of Systems: No other skin or systemic complaints except as noted in HPI or Assessment and Plan.   Objective  Well appearing patient in no apparent distress; mood and affect are within normal limits.  A full examination was performed including scalp, head, eyes, ears, nose, lips, neck, chest, axillae, abdomen, back, buttocks, bilateral upper extremities, bilateral lower extremities, hands, feet, fingers, toes, fingernails, and toenails. All findings within normal limits unless otherwise noted below.  face Rhytides and volume loss.    Assessment & Plan   Lentigines - Scattered tan macules - Due to sun exposure - Benign-appearing, observe - Recommend daily broad spectrum sunscreen SPF 30+ to sun-exposed areas, reapply every 2 hours as needed. - Call for any changes  Seborrheic Keratoses - Stuck-on, waxy, tan-brown papules and/or plaques  - Benign-appearing - Discussed benign etiology and prognosis. - Observe - Call for any changes  Melanocytic Nevi - Tan-brown and/or pink-flesh-colored symmetric macules and papules - Benign appearing on exam today - Observation - Call clinic for new or changing moles - Recommend daily use of broad spectrum spf 30+ sunscreen to sun-exposed areas.  --Check toenails when remove polish.  Hemangiomas - Red papules - Discussed benign nature -  Observe - Call for any changes  Actinic Damage - Chronic condition, secondary to cumulative UV/sun exposure - diffuse scaly erythematous macules with underlying dyspigmentation - Recommend daily broad spectrum sunscreen SPF 30+ to sun-exposed areas, reapply every 2 hours as needed.  - Staying in the shade or wearing long sleeves, sun glasses (UVA+UVB protection) and wide brim hats (4-inch brim around the entire circumference of the hat) are also recommended for sun protection.  - Call for new or changing lesions.  Cafe au Lait  - Tan patch at right lateral thigh - Genetic - Benign, observe - Call for any changes  Skin cancer screening performed today.  Elastosis of skin face  Will prescribe [employer]inals Anti-Aging Tretinoin 0.025%/Niacinamide/Vitamin C/Vitamin E/Turmeric/Resveratrol with Hyaluronic Acid. Apply pea sized amount nightly to the entire face.  The patient was advised this is not covered by insurance since it is made by a compounding pharmacy. They will receive an email to check out and the medication will be mailed to their home.   Topical retinoid medications like tretinoin can cause dryness and irritation when first started. Only apply a pea-sized amount to the entire affected area. Avoid applying it around the eyes, edges of mouth and creases at the nose. If you experience irritation, use a good moisturizer first and/or apply the medicine less often. If you are doing well with the medicine, you can increase how often you use it until you are applying every night. Be careful with sun protection while using this medication as it can make you sensitive to  the sun. This medicine should not be used by pregnant women.     Return for TBSE 1-2 years.  I, Emelia Salisbury, CMA, am acting as scribe for Forest Gleason, MD.  Documentation: I have reviewed the above documentation for accuracy and completeness, and I agree with the above.  Forest Gleason, MD

## 2021-05-28 NOTE — Patient Instructions (Addendum)
Recommend taking Heliocare sun protection supplement daily in sunny weather for additional sun protection. For maximum protection on the sunniest days, you can take up to 2 capsules of regular Heliocare OR take 1 capsule of Heliocare Ultra. For prolonged exposure (such as a full day in the sun), you can repeat your dose of the supplement 4 hours after your first dose. Heliocare can be purchased at [employer], at some [facility] or at [email or web address].     Instructions for [facility] Medications  One or more of your medications was sent to the [facility] mail order compounding pharmacy. You will receive an email from them and can purchase the medicine through that link. It will then be mailed to your home at the address you confirmed. If for any reason you do not receive an email from them, please check your spam folder. If you still do not find the email, please let us know. [facility] phone number is [phone].   Will prescribe [facility] Anti-Aging Tretinoin 0.025%/Niacinamide/Vitamin C/Vitamin E/Turmeric/Resveratrol with Hyaluronic Acid. Apply pea sized amount nightly to the entire face.  The patient was advised this is not covered by insurance since it is made by a compounding pharmacy. They will receive an email to check out and the medication will be mailed to their home.   Topical retinoid medications like tretinoin can cause dryness and irritation when first started. Only apply a pea-sized amount to the entire affected area. Avoid applying it around the eyes, edges of mouth and creases at the nose. If you experience irritation, use a good moisturizer first and/or apply the medicine less often. If you are doing well with the medicine, you can increase how often you use it until you are applying every night. Be careful with sun protection while using this medication as it can make you sensitive to the sun. This medicine should not be used by pregnant women.     Melanoma ABCDEs  Melanoma is the most dangerous type of skin cancer, and is the leading cause of death from skin disease.  You are more likely to develop melanoma if you: Have light-colored skin, light-colored eyes, or red or blond hair Spend a lot of time in the sun Tan regularly, either outdoors or in a tanning bed Have had blistering sunburns, especially during childhood Have a close family member who has had a melanoma Have atypical moles or large birthmarks  Early detection of melanoma is key since treatment is typically straightforward and cure rates are extremely high if we catch it early.   The first sign of melanoma is often a change in a mole or a new dark spot.  The ABCDE system is a way of remembering the signs of melanoma.  A for asymmetry:  The two halves do not match. B for border:  The edges of the growth are irregular. C for color:  A mixture of colors are present instead of an even brown color. D for diameter:  Melanomas are usually (but not always) greater than 28mm - the size of a pencil eraser. E for evolution:  The spot keeps changing in size, shape, and color.  Please check your skin once per month between visits. You can use a small mirror in front and a large mirror behind you to keep an eye on the back side or your body.   If you see any new or changing lesions before your next follow-up, please call to schedule a visit.  Please continue daily skin  protection including broad spectrum sunscreen SPF 30+ to sun-exposed areas, reapplying every 2 hours as needed when you're outdoors.   Staying in the shade or wearing long sleeves, sun glasses (UVA+UVB protection) and wide brim hats (4-inch brim around the entire circumference of the hat) are also recommended for sun protection.    Some Recommended Sunscreens Include:  Good for Daily Wear (feels like lotion but NOT sweat resistant) Cerave AM Moisturizer with SPF EltaMD UV Lotion  Body or All Over  Sunscreen EltaMD UV active for body and face Blue lizard sensitive Sun bum mineral (avoid if sensitive to scent) Aveeno Positively Mineral Neutrogena sheer zinc (Slightly harder to rub in) [facility] clear zinc (Slightly harder to rub in)  Clear Face Sunscreen EltaMD UV Elements CeraVe hydrating sunscreen 50 face  Tinted Face Sunscreen Alastin Hydratint (good for most skin tones, may be slightly dark if you are very fair) Colorescience Sunforgettable Total Protection Face Shield (good for most skin tones) EltaMD UV Physical La Roche Posay Mineral Tinted Cotz Flawless Complexion   Powder Sunscreen (Nice for reapplying or applying on the go) Colorescience Sunforgettable Total Protection Brush on Shield (available in different tints)  Face Sunscreen Available in Different Tints Colorescience Sunforgettable Total Protection Brush on Shield  bareMinerals Complexion Rescue Tinted Hydrating Gel Cream Broad Spectrum SPF 30 UnSun mineral tinted (comes in medium/dark and light/medium)  Kids (over 6 months) - Mineral Sunscreens Recommended eltaMD UV Pure MDsolarSciences KidStick 40 SPF Aveeno Baby Continuous Protection Sensitive Zinc Oxide Blue Lizard Kids mineral based sunscreen lotion Mustela Mineral Sunscreen for face and body Neutrogena Sheer Zinc Kids Sunscreen Stick  Tinted to look like a tan PCAskin sheer tint body spray   If You Need Anything After Your Visit  If you have any questions or concerns for your doctor, please call our main line at [phone] and press option 4 to reach your doctor's medical assistant. If no one answers, please leave a voicemail as directed and we will return your call as soon as possible. Messages left after 4 pm will be answered the following business day.   You may also send [place] a message via [place]. We typically respond to MyChart messages within 1-2 business days.  For prescription refills, please ask your pharmacy to contact our office. Our fax  number is [phone].  If you have an urgent issue when the clinic is closed that cannot wait until the next business day, you can page your doctor at the number below.    Please note that while we do our best to be available for urgent issues outside of office hours, we are not available 24/7.   If you have an urgent issue and are unable to reach [place], you may choose to seek medical care at your doctor's office, retail clinic, urgent care center, or emergency room.  If you have a medical emergency, please immediately call 911 or go to the emergency department.  Pager Numbers  - Dr. Nehemiah Massed: [phone]  - Dr. Laurence Ferrari: [phone]  - Dr. Nicole Kindred: [phone]  In the event of inclement weather, please call our main line at [phone] for an update on the status of any delays or closures.  Dermatology Medication Tips: Please keep the boxes that topical medications come in in order to help keep track of the instructions about where and how to use these. Pharmacies typically print the medication instructions only on the boxes and not directly on the medication tubes.   If your medication is too expensive, please  contact our office at [phone] option 4 or send [place] a message through [place].   We are unable to tell what your co-pay for medications will be in advance as this is different depending on your insurance coverage. However, we may be able to find a substitute medication at lower cost or fill out paperwork to get insurance to cover a needed medication.   If a prior authorization is required to get your medication covered by your insurance company, please allow [place] 1-2 business days to complete this process.  Drug prices often vary depending on where the prescription is filled and some pharmacies may offer cheaper prices.  The website www.goodrx.com contains coupons for medications through different pharmacies. The prices here do not account for what the cost may be with help  from insurance (it may be cheaper with your insurance), but the website can give you the price if you did not use any insurance.  - You can print the associated coupon and take it with your prescription to the pharmacy.  - You may also stop by our office during regular business hours and pick up a GoodRx coupon card.  - If you need your prescription sent electronically to a different pharmacy, notify our office through [facility] or by phone at [phone] option 4.     Si Usted Necesita Algo Despus de Su Visita  Tambin puede enviarnos un mensaje a travs de Pharmacist, community. Por lo general respondemos a los mensajes de MyChart en el transcurso de 1 a 2 das hbiles.  Para renovar recetas, por favor pida a su farmacia que se ponga en contacto con nuestra oficina. Harland Dingwall de fax es [place] [phone].  Si tiene un asunto urgente cuando la clnica est cerrada y que no puede esperar hasta el siguiente da hbil, puede llamar/localizar a su doctor(a) al nmero que aparece a continuacin.   Por favor, tenga en cuenta que aunque hacemos todo lo posible para estar disponibles para asuntos urgentes fuera del horario de [place], no estamos disponibles las 24 horas del da, los 7 das de la [place].   Si tiene un problema urgente y no puede comunicarse con nosotros, puede optar por buscar atencin mdica  en el consultorio de su doctor(a), en una clnica privada, en un centro de atencin urgente o en una sala de emergencias.  Si tiene Engineering geologist, por favor llame inmediatamente al 911 o vaya a la sala de emergencias.  Nmeros de bper  - Dr. Nehemiah Massed: [phone]  - Dra. Moye: [phone]  - Dra. Nicole Kindred: [phone]  En caso de inclemencias del [place], por favor llame a Johnsie Kindred principal al [phone] para una actualizacin sobre el [place] de cualquier retraso o cierre.  Consejos para la medicacin en dermatologa: Por favor, guarde las cajas en las que vienen los  medicamentos de uso tpico para ayudarle a seguir las instrucciones sobre dnde y cmo usarlos. Las farmacias generalmente imprimen las instrucciones del medicamento slo en las cajas y no directamente en los tubos del [place].   Si su medicamento es muy caro, por favor, pngase en contacto con Zigmund Daniel llamando al [phone] y presione la opcin 4 o envenos un mensaje a travs de Pharmacist, community.   No podemos decirle cul ser su copago por los medicamentos por adelantado ya que esto es diferente dependiendo de la cobertura de su seguro. Sin embargo, es posible que podamos encontrar un medicamento sustituto a Electrical engineer un formulario para que el seguro [place]  medicamento que se considera necesario.   Si se requiere una autorizacin previa para que su compaa de seguros [place] su medicamento, por favor permtanos de 1 a 2 das hbiles para completar este proceso.  Los precios de los medicamentos varan con frecuencia dependiendo del Environmental consultant de dnde se surte la receta y alguna farmacias pueden ofrecer precios ms baratos.  El sitio web www.goodrx.com tiene cupones para medicamentos de Airline pilot. Los precios aqu no tienen en cuenta lo que podra costar con la ayuda del seguro (puede ser ms barato con su seguro), pero el sitio web puede darle el precio si no utiliz Research scientist (physical sciences).  - Puede imprimir el cupn correspondiente y llevarlo con su receta a la farmacia.  - Tambin puede pasar por nuestra oficina durante el horario de atencin regular y Charity fundraiser una tarjeta de cupones de GoodRx.  - Si necesita que su receta se enve electrnicamente a una farmacia diferente, informe a nuestra oficina a travs de MyChart de [place] o por telfono llamando al [phone] y presione la opcin 4.

## 2021-06-06 ENCOUNTER — Encounter: Payer: Self-pay | Admitting: Dermatology

## 2021-08-12 ENCOUNTER — Encounter: Payer: Self-pay | Admitting: Obstetrics and Gynecology

## 2021-08-12 DIAGNOSIS — R1010 Upper abdominal pain, unspecified: Secondary | ICD-10-CM

## 2021-09-14 ENCOUNTER — Ambulatory Visit
Admission: RE | Admit: 2021-09-14 | Discharge: 2021-09-14 | Disposition: A | Discharge status: Home / Self Care | Payer: BC Managed Care – PPO | Source: Ambulatory Visit | Attending: Emergency Medicine | Admitting: Emergency Medicine

## 2021-09-14 VITALS — BP 122/81 | HR 97 | Temp 97.9°F | Resp 18

## 2021-09-14 DIAGNOSIS — J029 Acute pharyngitis, unspecified: Secondary | ICD-10-CM

## 2021-09-14 DIAGNOSIS — U071 COVID-19: Secondary | ICD-10-CM | POA: Diagnosis not present

## 2021-09-14 LAB — POCT RAPID STREP A (OFFICE): Rapid Strep A Screen: NEGATIVE

## 2021-09-14 MED ORDER — LIDOCAINE VISCOUS HCL 2 % MT SOLN
15.0000 mL | OROMUCOSAL | 0 refills | Status: AC | PRN
Start: 2021-09-14 — End: ?

## 2021-09-14 NOTE — ED Triage Notes (Signed)
Pt reports sore throat since 3 am this morning. States it felt like ulcers were in her throat. States over the course of the day, symptoms improved.  Tested positive for Covid on Friday.

## 2021-09-14 NOTE — Discharge Instructions (Addendum)
Your strep test is negative.  Follow up with your primary care provider if your symptoms are not improving.    

## 2021-09-14 NOTE — ED Provider Notes (Signed)
Debra Haynes    CSN: [id number] Arrival date & time: 09/14/21  1441      History   Chief Complaint Chief Complaint  Patient presents with   Sore Throat    I tested positive for COVId on Friday and am feeling better except that my throat hurts so badly I can barely swallow. Worried I may have a secondary strep infection. Feels like my throat is lined with ulcers. - Entered by patient   Covid Positive    HPI Debra Haynes is a 56 y.o. female.  Patient presents with a sore throat since early morning.  She also has cough and congestion since 09/11/2021; these symptoms are improving.  She tested positive for COVID at home on 09/11/2021.  She denies fever, rash, shortness of breath, vomiting, diarrhea, or other symptoms.  No OTC medications taken today.  Her medical history includes hypertension.  The history is provided by the patient and medical records.   Past Medical History:  Diagnosis Date   AR (allergic rhinitis)    Hypertension    TMJ (dislocation of temporomandibular joint)    Vitamin D deficiency     Patient Active Problem List   Diagnosis Date Noted   Encounter for screening colonoscopy    Menopause 11/30/2017   Osteopenia 11/30/2017   Family history of osteoporosis 10/09/2015   H/O vitamin D deficiency 10/09/2015   Chronic hypertension 10/09/2015   Climacteric 10/09/2015    Past Surgical History:  Procedure Laterality Date   COLONOSCOPY     COLONOSCOPY WITH PROPOFOL N/A 01/01/2020   Procedure: COLONOSCOPY WITH PROPOFOL;  Surgeon: Lucilla Lame, MD;  [facility] END[facility]COPY;  Service: Endoscopy;  Laterality: N/A;    OB History     Gravida  2   Para  2   Term  2   Preterm      AB      Living  2      SAB      IAB      Ectopic      Multiple      Live Births  2            Home Medications    Prior to Admission medications   Medication Sig Start Date End Date Taking? Authorizing Provider  lidocaine (XYLOCAINE) 2 % solution Use  as directed 15 mLs in the mouth or throat as needed for mouth pain. 09/14/21  Yes Sharion Balloon, NP  fluticasone (FLONASE) 50 MCG/ACT nasal spray SHAKE LIQUID AND USE 1 SPRAY IN EACH NOSTRIL DAILY 10/19/18   Rubie Maid, MD  labetalol (NORMODYNE) 100 MG tablet TAKE 1 TABLET(100 MG) BY MOUTH DAILY 02/19/21   Rubie Maid, MD  loratadine (CLARITIN) 10 MG tablet Take 10 mg by mouth daily.    [provider]  Melatonin-Pyridoxine (MELATIN PO) Take by mouth. As needed    [provider]  Multiple Vitamins-Minerals (MULTIVITAMIN GUMMIES WOMENS PO) Take by mouth.    [provider]  PREMPRO 0.625-5 MG tablet TAKE 1 TABLET BY MOUTH DAILY 03/22/21   Rubie Maid, MD    Family History Family History  Problem Relation Age of Onset   Diabetes Father    Hypertension Father    Breast cancer Maternal Aunt    Colon cancer Maternal Grandfather    Hypertension Mother    Hyperthyroidism Mother        Ablation    Breast cancer Cousin 51       mat cousin  Ovarian cancer Neg Hx     Social History Social History   Tobacco Use   Smoking status: Never   Smokeless tobacco: Never  Vaping Use   Vaping Use: Never used  Substance Use Topics   Alcohol use: Yes    Alcohol/week: 3.0 standard drinks    Types: 3 Glasses of wine per week    Comment: 3 DAYS WEEK   Drug use: No     Allergies   Azithromycin and Yaz [drospirenone-ethinyl estradiol]   Review of Systems Review of Systems  Constitutional:  Negative for chills and fever.  HENT:  Positive for congestion and sore throat. Negative for ear pain.   Respiratory:  Positive for cough. Negative for shortness of breath.   Cardiovascular:  Negative for chest pain and palpitations.  Gastrointestinal:  Negative for diarrhea and vomiting.  Skin:  Negative for color change and rash.  All other systems reviewed and are negative.   Physical Exam Triage Vital Signs ED Triage Vitals  Enc Vitals Group     BP      Pulse       Resp      Temp      Temp src      SpO2      Weight      Height      Head Circumference      Peak Flow      Pain Score      Pain Loc      Pain Edu?      Excl. in [place]?    No data found.  Updated Vital Signs BP 122/81   Pulse 97   Temp 97.9 F (36.6 C)   Resp 18   LMP 06/17/2017 (Exact Date)   SpO2 98%   Visual Acuity Right Eye Distance:   Left Eye Distance:   Bilateral Distance:    Right Eye Near:   Left Eye Near:    Bilateral Near:     Physical Exam Vitals and nursing note reviewed.  Constitutional:      General: She is not in acute distress.    Appearance: Normal appearance. She is well-developed. She is not ill-appearing.  HENT:     Right Ear: Tympanic membrane normal.     Left Ear: Tympanic membrane normal.     Nose: Nose normal.     Mouth/Throat:     Mouth: Mucous membranes are moist.     Pharynx: Posterior oropharyngeal erythema present.  Cardiovascular:     Rate and Rhythm: Normal rate and regular rhythm.     Heart sounds: Normal heart sounds.  Pulmonary:     Effort: Pulmonary effort is normal. No respiratory distress.     Breath sounds: Normal breath sounds.  Musculoskeletal:     Cervical back: Neck supple.  Skin:    General: Skin is warm and dry.  Neurological:     Mental Status: She is alert.  Psychiatric:        Mood and Affect: Mood normal.        Behavior: Behavior normal.     UC Treatments / Results  Labs (all labs ordered are listed, but only abnormal results are displayed) Labs Reviewed  POCT RAPID STREP A (OFFICE)    EKG   Radiology No results found.  Procedures Procedures (including critical care time)  Medications Ordered in UC Medications - No data to display  Initial Impression / Assessment and Plan / UC Course  I have reviewed the triage vital  signs and the nursing notes.  Pertinent labs & imaging results that were available during my care of the patient were reviewed by me and considered in my medical decision  making (see chart for details).   COVID-19, sore throat.  Rapid strep negative.  Patient tested COVID positive on 09/11/2021; symptom onset same day.  Treating sore throat with viscous lidocaine.  Discussed Tylenol or ibuprofen as needed.  Patient declines COVID treatment at this time.  Instructed her to follow-up with her PCP as needed.  Education provided on managing COVID and sore throat.  Patient agrees to plan of care.   Final Clinical Impressions(s) / UC Diagnoses   Final diagnoses:  COVID-19  Sore throat     Discharge Instructions      Your strep test is negative.  Follow up with your primary care provider if your symptoms are not improving.        ED Prescriptions     Medication Sig Dispense Auth. Provider   lidocaine (XYLOCAINE) 2 % solution Use as directed 15 mLs in the mouth or throat as needed for mouth pain. 100 mL Sharion Balloon, NP      PDMP not reviewed this encounter.   Sharion Balloon, NP 09/14/21 1527

## 2021-11-18 IMAGING — MG MM DIGITAL SCREENING BILAT W/ TOMO AND CAD
8 series · 8 of 24 positions shown · non-contrast
Comparison: Previous exam(s).

CLINICAL DATA: Screening.

EXAM:
DIGITAL SCREENING BILATERAL MAMMOGRAM WITH TOMOSYNTHESIS AND CAD
TECHNIQUE: Bilateral screening digital craniocaudal and mediolateral oblique
mammograms were obtained. Bilateral screening digital breast
tomosynthesis was performed. The images were evaluated with
computer-aided detection.

[L CC synth-2D]
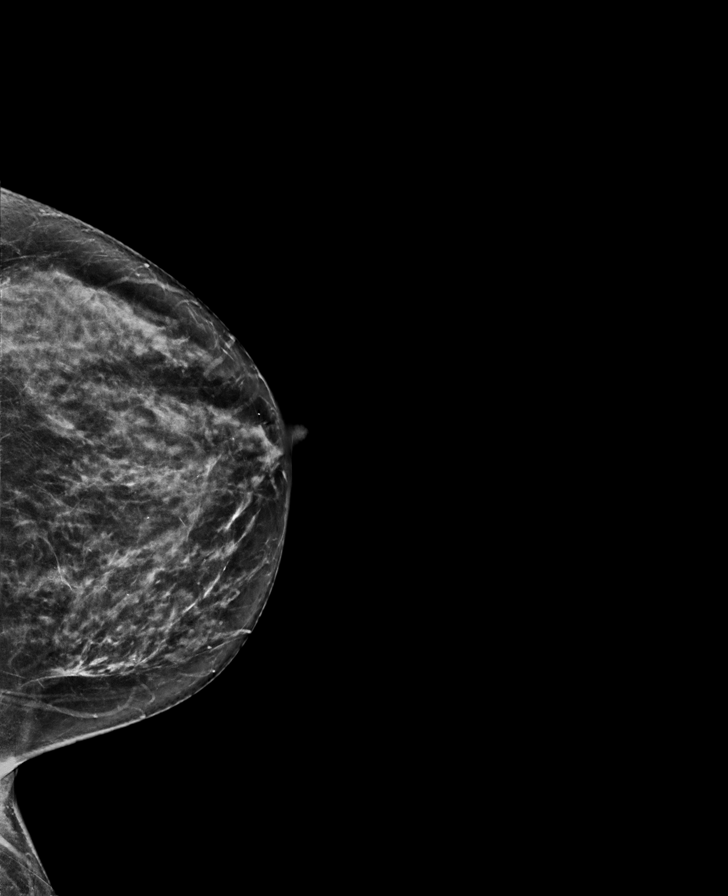

[R MLO synth-2D]
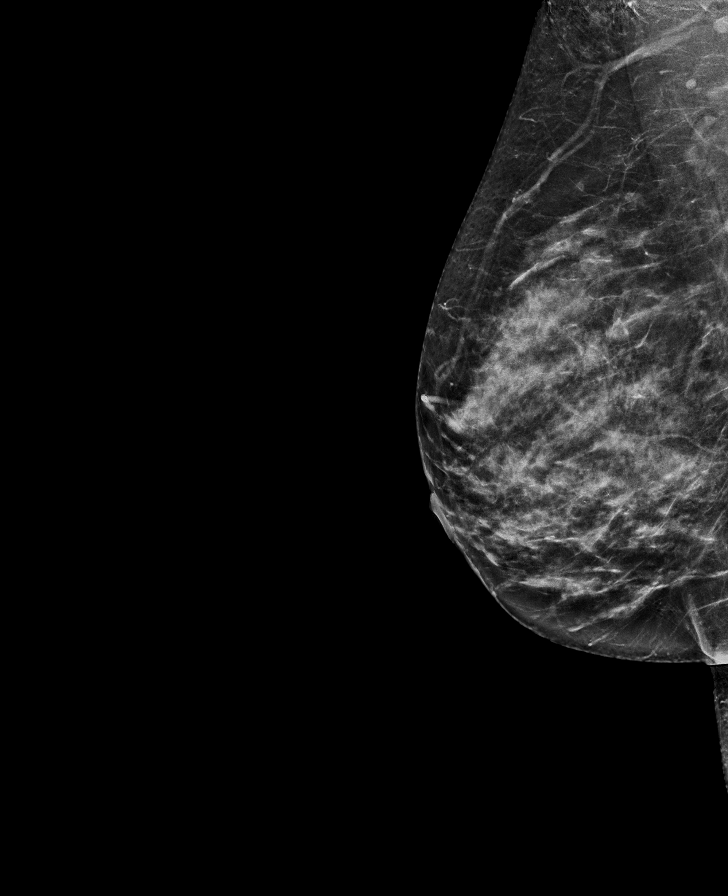

[R CC synth-2D]
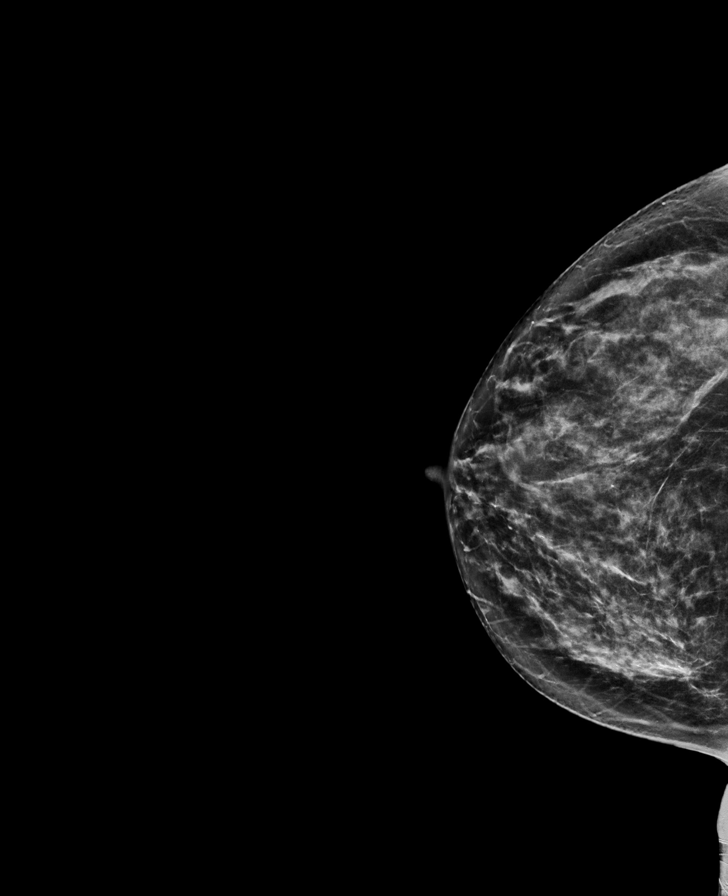

[L MLO synth-2D]
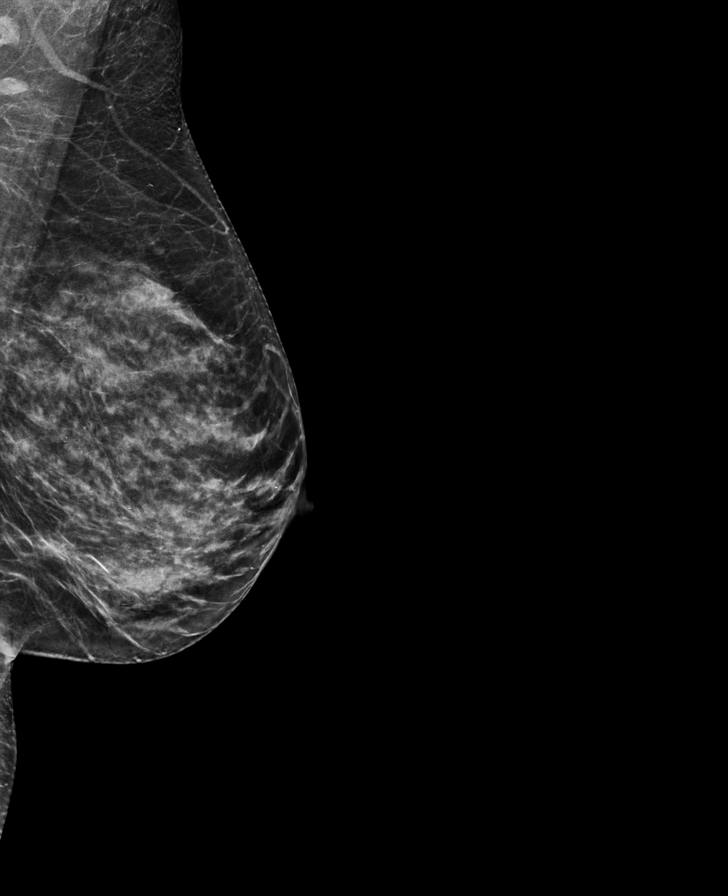

[R CC tomo · tomo slice 33/65.0]
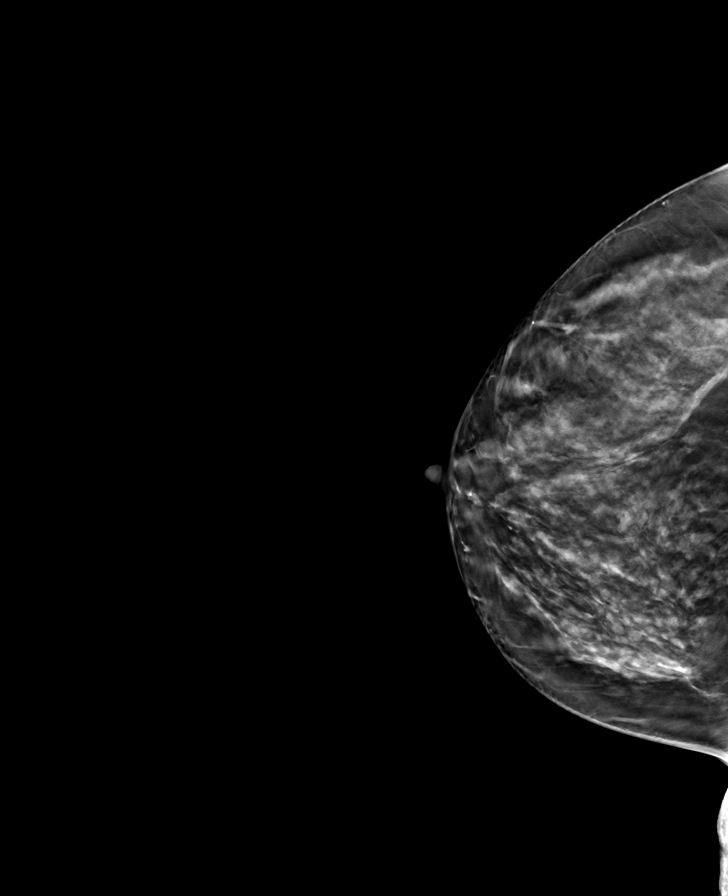

[R MLO tomo · tomo slice 33/66.0]
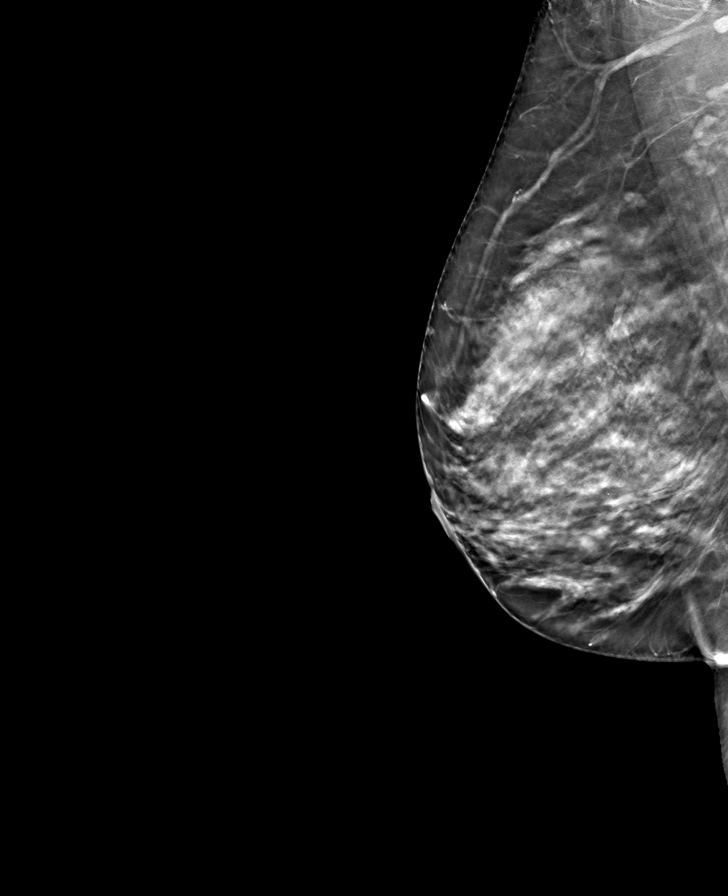

[L CC tomo · tomo slice 33/65.0]
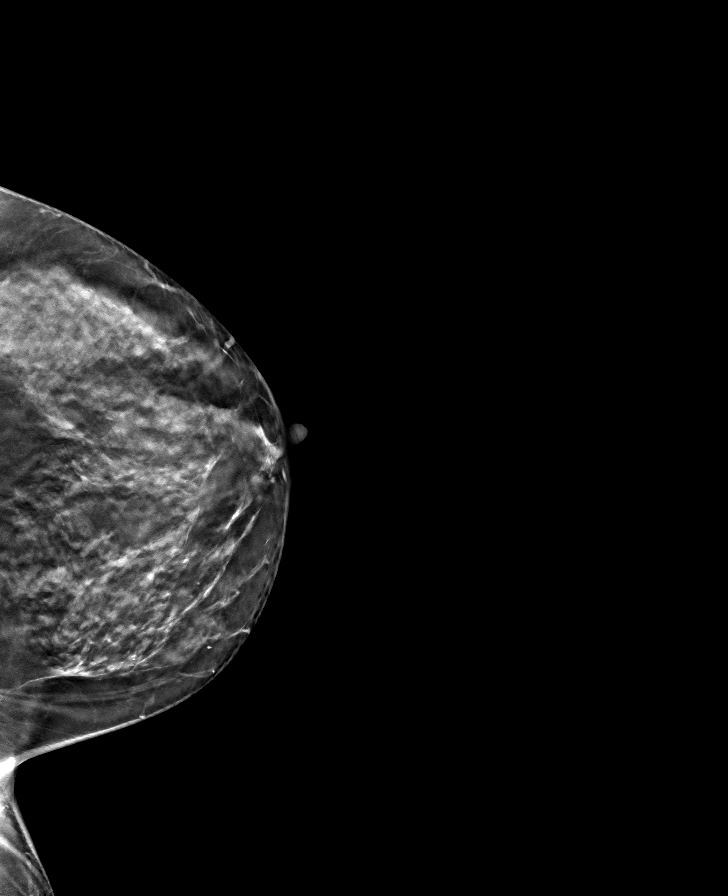

[L MLO tomo · tomo slice 33/66.0]
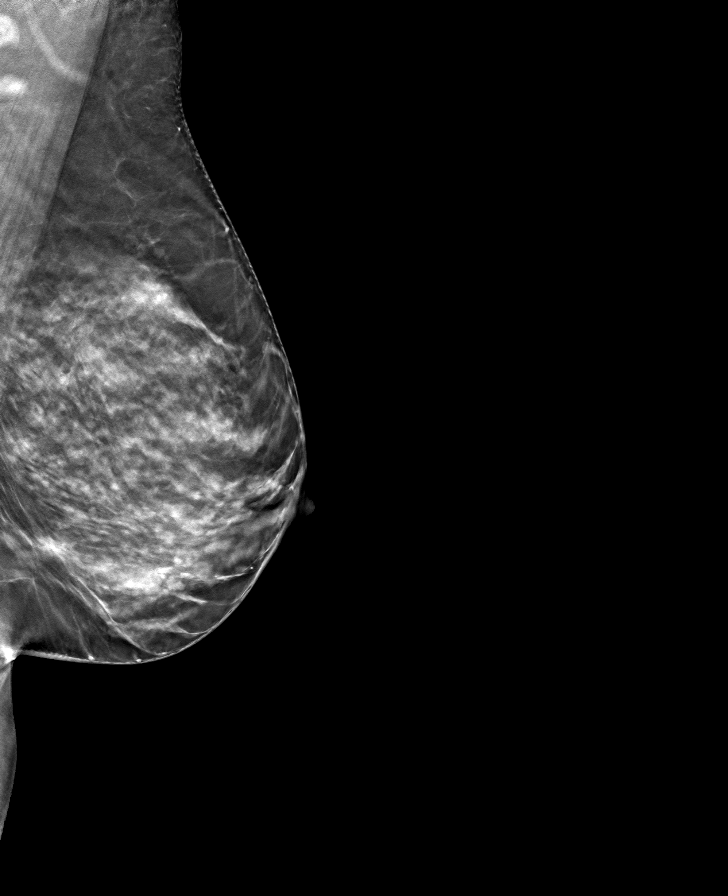

[8 of 24 positions shown; findings below may reference images not displayed]

ACR Breast Density Category c: The breast tissue is heterogeneously
dense, which may obscure small masses.
FINDINGS: There are no findings suspicious for malignancy.
IMPRESSION: No mammographic evidence of malignancy. A result letter of this
screening mammogram will be mailed directly to the patient.

RECOMMENDATION:
Screening mammogram in one year. (Code:Q3-W-BC3)

BI-RADS CATEGORY  1: Negative.

## 2021-12-29 ENCOUNTER — Ambulatory Visit: Payer: BC Managed Care – PPO | Admitting: Gastroenterology

## 2022-01-05 ENCOUNTER — Ambulatory Visit
Admission: EM | Admit: 2022-01-05 | Discharge: 2022-01-05 | Disposition: A | Discharge status: Home / Self Care | Payer: BC Managed Care – PPO | Attending: Emergency Medicine | Admitting: Emergency Medicine

## 2022-01-05 DIAGNOSIS — L237 Allergic contact dermatitis due to plants, except food: Secondary | ICD-10-CM | POA: Diagnosis not present

## 2022-01-05 MED ORDER — DEXAMETHASONE SODIUM PHOSPHATE 10 MG/ML IJ SOLN
10.0000 mg | Freq: Once | INTRAMUSCULAR | Status: AC
  Administered 2022-01-05: 10 mg via INTRAMUSCULAR

## 2022-01-05 MED ORDER — PREDNISONE 10 MG (21) PO TBPK: ORAL_TABLET | Freq: Every day | ORAL | 0 refills | Status: DC

## 2022-01-05 NOTE — ED Triage Notes (Signed)
Patient to Urgent Care with complaints of left eye itching/ facial rash. Applied some cortisone bug ointment.  Patient was pulling weeds and believes she might have rubbed her eyes after touching poison ivy/ oak.

## 2022-01-05 NOTE — Discharge Instructions (Addendum)
You were given an injection of a steroid called dexamethasone.  Start the prednisone taper tomorrow as directed.    Take Benadryl or Zyrtec as directed.    Follow up with your primary care provider if your symptoms are not improving.

## 2022-01-05 NOTE — ED Provider Notes (Signed)
Debra Haynes    CSN: [id number] Arrival date & time: 01/05/22  1216      History   Chief Complaint Chief Complaint  Patient presents with   Rash    HPI Debra Haynes is a 56 y.o. female.  Patient presents with pruritic rash on her face x 2 days which started after she came in contact with poison ivy while working in her yard.  She thinks she may have touched her face after touching poison ivy.  The rash has spread to her left upper eyelid.  She denies eye pain, changes in vision, eye drainage, fever, chills, sore throat, difficulty swallowing, cough, shortness of breath, or other symptoms.  Treatment at home with topical cortisone ointment.  Her medical history includes hypertension.  The history is provided by the patient and medical records.    Past Medical History:  Diagnosis Date   AR (allergic rhinitis)    Hypertension    TMJ (dislocation of temporomandibular joint)    Vitamin D deficiency     Patient Active Problem List   Diagnosis Date Noted   Encounter for screening colonoscopy    Menopause 11/30/2017   Osteopenia 11/30/2017   Family history of osteoporosis 10/09/2015   H/O vitamin D deficiency 10/09/2015   Chronic hypertension 10/09/2015   Climacteric 10/09/2015    Past Surgical History:  Procedure Laterality Date   COLONOSCOPY     COLONOSCOPY WITH PROPOFOL N/A 01/01/2020   Procedure: COLONOSCOPY WITH PROPOFOL;  Surgeon: Lucilla Lame, MD;  Location: [facility] ENDOSCOPY;  Service: Endoscopy;  Laterality: N/A;    OB History     Gravida  2   Para  2   Term  2   Preterm      AB      Living  2      SAB      IAB      Ectopic      Multiple      Live Births  2            Home Medications    Prior to Admission medications   Medication Sig Start Date End Date Taking? Authorizing Provider  predniSONE (STERAPRED UNI-PAK 21 TAB) 10 MG (21) TBPK tablet Take by mouth daily. As directed 01/06/22  Yes Sharion Balloon, NP  fluticasone  (FLONASE) 50 MCG/ACT nasal spray SHAKE LIQUID AND USE 1 SPRAY IN EACH NOSTRIL DAILY 10/19/18   Rubie Maid, MD  labetalol (NORMODYNE) 100 MG tablet TAKE 1 TABLET(100 MG) BY MOUTH DAILY 02/19/21   Rubie Maid, MD  lidocaine (XYLOCAINE) 2 % solution Use as directed 15 mLs in the mouth or throat as needed for mouth pain. 09/14/21   Sharion Balloon, NP  loratadine (CLARITIN) 10 MG tablet Take 10 mg by mouth daily.    [provider]  Melatonin-Pyridoxine (MELATIN PO) Take by mouth. As needed    [provider]  Multiple Vitamins-Minerals (MULTIVITAMIN GUMMIES WOMENS PO) Take by mouth.    [provider]  PREMPRO 0.625-5 MG tablet TAKE 1 TABLET BY MOUTH DAILY 03/22/21   Rubie Maid, MD    Family History Family History  Problem Relation Age of Onset   Diabetes Father    Hypertension Father    Breast cancer Maternal Aunt    Colon cancer Maternal Grandfather    Hypertension Mother    Hyperthyroidism Mother        Ablation    Breast cancer Cousin 52  mat cousin   Ovarian cancer Neg Hx     Social History Social History   Tobacco Use   Smoking status: Never   Smokeless tobacco: Never  Vaping Use   Vaping Use: Never used  Substance Use Topics   Alcohol use: Yes    Alcohol/week: 3.0 standard drinks of alcohol    Types: 3 Glasses of wine per week    Comment: 3 DAYS WEEK   Drug use: No     Allergies   Azithromycin and Yaz [drospirenone-ethinyl estradiol]   Review of Systems Review of Systems  Constitutional:  Negative for chills and fever.  HENT:  Negative for ear pain, sore throat and trouble swallowing.   Eyes:  Negative for pain, discharge, redness and visual disturbance.  Respiratory:  Negative for cough and shortness of breath.   Musculoskeletal:  Negative for arthralgias and back pain.  Skin:  Positive for rash. Negative for color change.  All other systems reviewed and are negative.    Physical Exam Triage Vital Signs ED Triage Vitals   Enc Vitals Group     BP      Pulse      Resp      Temp      Temp src      SpO2      Weight      Height      Head Circumference      Peak Flow      Pain Score      Pain Loc      Pain Edu?      Excl. in [place]?    No data found.  Updated Vital Signs BP 119/77   Pulse 83   Temp 97.9 F (36.6 C)   Resp 18   Ht '4\' 10"'$  (1.473 m)   Wt 136 lb (61.7 kg)   LMP 06/17/2017 (Exact Date)   SpO2 97%   BMI 28.42 kg/m   Visual Acuity Right Eye Distance:   Left Eye Distance:   Bilateral Distance:    Right Eye Near:   Left Eye Near:    Bilateral Near:     Physical Exam Vitals and nursing note reviewed.  Constitutional:      General: She is not in acute distress.    Appearance: She is well-developed. She is not ill-appearing.  HENT:     Mouth/Throat:     Mouth: Mucous membranes are moist.     Pharynx: Oropharynx is clear.  Eyes:     Extraocular Movements: Extraocular movements intact.     Conjunctiva/sclera: Conjunctivae normal.     Pupils: Pupils are equal, round, and reactive to light.  Cardiovascular:     Rate and Rhythm: Normal rate and regular rhythm.     Heart sounds: Normal heart sounds.  Pulmonary:     Effort: Pulmonary effort is normal. No respiratory distress.     Breath sounds: Normal breath sounds.  Musculoskeletal:     Cervical back: Neck supple.  Skin:    General: Skin is warm and dry.     Findings: Rash present.     Comments: Mildly erythematous papular rash on left upper lid, left side of face, left side of neck.  No open wounds or drainage.  Neurological:     Mental Status: She is alert.  Psychiatric:        Mood and Affect: Mood normal.        Behavior: Behavior normal.      UC Treatments / Results  Labs (all labs ordered are listed, but only abnormal results are displayed) Labs Reviewed - No data to display  EKG   Radiology No results found.  Procedures Procedures (including critical care time)  Medications Ordered in  UC Medications  dexamethasone (DECADRON) injection 10 mg (10 mg Intramuscular Given 01/05/22 1323)    Initial Impression / Assessment and Plan / UC Course  I have reviewed the triage vital signs and the nursing notes.  Pertinent labs & imaging results that were available during my care of the patient were reviewed by me and considered in my medical decision making (see chart for details).    Poison ivy dermatitis.  Injection of dexamethasone given here.  Starting prednisone taper tomorrow.  Discussed Zyrtec and Benadryl.  Education provided on poison ivy dermatitis. Instructed patient to follow up with her PCP if her symptoms are not improving.  She agrees to plan of care.    Final Clinical Impressions(s) / UC Diagnoses   Final diagnoses:  Poison ivy dermatitis     Discharge Instructions      You were given an injection of a steroid called dexamethasone.  Start the prednisone taper tomorrow as directed.    Take Benadryl or Zyrtec as directed.    Follow up with your primary care provider if your symptoms are not improving.           ED Prescriptions     Medication Sig Dispense Auth. Provider   predniSONE (STERAPRED UNI-PAK 21 TAB) 10 MG (21) TBPK tablet Take by mouth daily. As directed 21 tablet Sharion Balloon, NP      PDMP not reviewed this encounter.   Sharion Balloon, NP 01/05/22 1325

## 2022-01-13 ENCOUNTER — Ambulatory Visit
Admission: RE | Admit: 2022-01-13 | Discharge: 2022-01-13 | Disposition: A | Discharge status: Home / Self Care | Payer: BC Managed Care – PPO | Source: Ambulatory Visit | Attending: Emergency Medicine | Admitting: Emergency Medicine

## 2022-01-13 VITALS — BP 125/74 | HR 90 | Temp 98.1°F | Resp 18 | Ht <= 58 in | Wt 136.0 lb

## 2022-01-13 DIAGNOSIS — L237 Allergic contact dermatitis due to plants, except food: Secondary | ICD-10-CM | POA: Diagnosis not present

## 2022-01-13 MED ORDER — HYDROXYZINE HCL 25 MG PO TABS: 25.0000 mg | ORAL_TABLET | Freq: Four times a day (QID) | ORAL | 0 refills | Status: DC | PRN

## 2022-01-13 MED ORDER — PREDNISONE 10 MG PO TABS: ORAL_TABLET | ORAL | 0 refills | Status: DC

## 2022-01-13 NOTE — ED Triage Notes (Signed)
Patient to Urgent Care with complaints of worsening poison ivy, reports that her rash is worsening and has now spread to her right eye, the back of her neck, and chest.  Previously treated with IM decadron and prednisone. Reports as she has decreased the taper of prednisone the rash has returned. Has been taking benadryl.

## 2022-01-13 NOTE — Discharge Instructions (Addendum)
Use TecNu before going out in areas with known poison ivy/oak.  This will help prevent you from getting poison ivy/oak.  If you get a rash, you can use Zanfel or TecNu extreme to deactivate the oil, which will stop the rash from spreading and help with the itching.  Apply antibacterial ointment on scabbed areas to help prevent infection.  If you were given steroids, make sure you finish all of them.  You may take Claritin,  Zyrtec during the day, Benadryl or Atarax at night. Dissolve 1 packet (or tablet) of Domeboro (aluminum acetate) in 1 pint of luke-warm water. Soak the affected areas with luke-warm Domeboro solution for 5-10 minutes twice daily.  apply gauze soaked in the domeboro.  Gently pat dry, You may also take oatmeal baths with Aveeno oatmeal (1 cup in half full bathtub) or cornstarch/baking soda (1 cup each in half full bathtub). To prevent the oatmeal from caking in pipes, place it in a tied sock before dropping it into the bathtub.  Go to www.goodrx.com to look up your medications. This will give you a list of where you can find your prescriptions at the most affordable prices. Or ask the pharmacist what the cash price is, or if they have any other discount programs available to help make your medication more affordable. This can be less expensive than what you would pay with insurance.

## 2022-01-13 NOTE — ED Provider Notes (Signed)
HPI  SUBJECTIVE:  Debra Haynes is a 56 y.o. female who presents with a pruritic, erythematous rash along her right face, eye, bilateral chest along her hairline, left knee and forearm starting 2 days ago while on day #6 of her prednisone taper.  No known reexposure to poison ivy/poison oak, but suspects her dog may have the oil on his fur.  No visual changes, eye pain, photophobia, preceding paresthesias.  No new lotions, soaps, detergents.  She has been taking Benadryl at night, Zyrtec in the morning, using over-the-counter topical cortisone, TecNu, and finished a 6-day taper of prednisone with improvement in her symptoms.  Symptoms are worse with sweating.  She has a past medical history of hypertension.  PCP: Her OB/GYN  Patient was seen here on 9/19 for poison ivy dermatitis in a different location, given Decadron and 6-day 10 mg taper of prednisone, advised to start Zyrtec and Benadryl.  Past Medical History:  Diagnosis Date   AR (allergic rhinitis)    Hypertension    TMJ (dislocation of temporomandibular joint)    Vitamin D deficiency     Past Surgical History:  Procedure Laterality Date   COLONOSCOPY     COLONOSCOPY WITH PROPOFOL N/A 01/01/2020   Procedure: COLONOSCOPY WITH PROPOFOL;  Surgeon: Lucilla Lame, MD;  Location: [facility] ENDOSCOPY;  Service: Endoscopy;  Laterality: N/A;    Family History  Problem Relation Age of Onset   Diabetes Father    Hypertension Father    Breast cancer Maternal Aunt    Colon cancer Maternal Grandfather    Hypertension Mother    Hyperthyroidism Mother        Ablation    Breast cancer Cousin 73       mat cousin   Ovarian cancer Neg Hx     Social History   Tobacco Use   Smoking status: Never   Smokeless tobacco: Never  Vaping Use   Vaping Use: Never used  Substance Use Topics   Alcohol use: Yes    Alcohol/week: 3.0 standard drinks of alcohol    Types: 3 Glasses of wine per week    Comment: 3 DAYS WEEK   Drug use: No    No current  facility-administered medications for this encounter.  Current Outpatient Medications:    hydrOXYzine (ATARAX) 25 MG tablet, Take 1 tablet (25 mg total) by mouth every 6 (six) hours as needed for itching., Disp: 30 tablet, Rfl: 0   predniSONE (DELTASONE) 10 MG tablet, 6 tabs on day 1-2, 5 tabs on day 3-4, 4 tabs on day 5-6, 3 tabs on day 7-8, 2 tabs day 9-10, 1 tab day 11-12, Disp: 42 tablet, Rfl: 0   fluticasone (FLONASE) 50 MCG/ACT nasal spray, SHAKE LIQUID AND USE 1 SPRAY IN EACH NOSTRIL DAILY, Disp: 16 g, Rfl: 2   labetalol (NORMODYNE) 100 MG tablet, TAKE 1 TABLET(100 MG) BY MOUTH DAILY, Disp: 90 tablet, Rfl: 3   lidocaine (XYLOCAINE) 2 % solution, Use as directed 15 mLs in the mouth or throat as needed for mouth pain., Disp: 100 mL, Rfl: 0   loratadine (CLARITIN) 10 MG tablet, Take 10 mg by mouth daily., Disp: , Rfl:    Melatonin-Pyridoxine (MELATIN PO), Take by mouth. As needed, Disp: , Rfl:    Multiple Vitamins-Minerals (MULTIVITAMIN GUMMIES WOMENS PO), Take by mouth., Disp: , Rfl:    PREMPRO 0.625-5 MG tablet, TAKE 1 TABLET BY MOUTH DAILY, Disp: 84 tablet, Rfl: 3  Allergies  Allergen Reactions   Azithromycin    Yaz [  Drospirenone-Ethinyl Estradiol] Rash     ROS  As noted in HPI.   Physical Exam  BP 125/74   Pulse 90   Temp 98.1 F (36.7 C)   Resp 18   Ht '4\' 10"'$  (1.473 m)   Wt 61.7 kg   LMP 06/17/2017 (Exact Date)   SpO2 97%   BMI 28.42 kg/m   Constitutional: Well developed, well nourished, no acute distress Eyes:  EOMI, conjunctiva normal bilaterally HENT: Normocephalic, atraumatic,mucus membranes moist Respiratory: Normal inspiratory effort Cardiovascular: Normal rate GI: nondistended skin: Nontender, erythematous rash over left neck, above right eye, bilateral chest, left forearm.  No blisters, crusting, excoriations.         Musculoskeletal: no deformities Neurologic: Alert & oriented x 3, no focal neuro deficits Psychiatric: Speech and behavior  appropriate   ED Course   Medications - No data to display  No orders of the defined types were placed in this encounter.   No results found for this or any previous visit (from the past 24 hour(s)). No results found.  ED Clinical Impression  1. Poison ivy dermatitis      ED Assessment/Plan     Previous records reviewed.  As noted in HPI.  I considered shingles, but it is nonpainful, nontender, crosses multiple dermatomes and the midline as well.  I believe that she was reexposed to poison ivy oil from her dog.  Presentation consistent with a reexposure to poison ivy.  Home with Zyrtec daily, Benadryl or Atarax at night, 12-day prednisone taper, continue TecNu, Domeboro soaks, follow-up with PCP as needed.   Discussed  MDM, treatment plan, and plan for follow-up with patient.patient agrees with plan.   Meds ordered this encounter  Medications   predniSONE (DELTASONE) 10 MG tablet    Sig: 6 tabs on day 1-2, 5 tabs on day 3-4, 4 tabs on day 5-6, 3 tabs on day 7-8, 2 tabs day 9-10, 1 tab day 11-12    Dispense:  42 tablet    Refill:  0   hydrOXYzine (ATARAX) 25 MG tablet    Sig: Take 1 tablet (25 mg total) by mouth every 6 (six) hours as needed for itching.    Dispense:  30 tablet    Refill:  0      *This clinic note was created using [employer]. Therefore, there may be occasional mistakes despite careful proofreading.  ?    Melynda Ripple, MD 01/14/22 [phone]

## 2022-02-17 DIAGNOSIS — H40033 Anatomical narrow angle, bilateral: Secondary | ICD-10-CM | POA: Diagnosis not present

## 2022-03-09 ENCOUNTER — Encounter: Payer: Self-pay | Admitting: Obstetrics and Gynecology

## 2022-03-17 ENCOUNTER — Telehealth: Payer: Self-pay | Admitting: Obstetrics and Gynecology

## 2022-03-22 NOTE — Telephone Encounter (Signed)
Reached out to pt to schedule annual exam with Dr. Marcelline Mates.  Left message for pt to call back.

## 2022-03-23 ENCOUNTER — Encounter: Payer: Self-pay | Admitting: Obstetrics and Gynecology

## 2022-03-23 ENCOUNTER — Other Ambulatory Visit: Payer: Self-pay

## 2022-03-23 MED ORDER — LABETALOL HCL 100 MG PO TABS: ORAL_TABLET | ORAL | 3 refills | Status: DC

## 2022-05-18 ENCOUNTER — Other Ambulatory Visit: Payer: Self-pay | Admitting: Obstetrics and Gynecology

## 2022-05-18 DIAGNOSIS — Z1231 Encounter for screening mammogram for malignant neoplasm of breast: Secondary | ICD-10-CM

## 2022-05-19 ENCOUNTER — Ambulatory Visit: Payer: BC Managed Care – PPO | Admitting: Obstetrics and Gynecology

## 2022-05-28 NOTE — Progress Notes (Unsigned)
ANNUAL PREVENTATIVE CARE GYNECOLOGY  ENCOUNTER NOTE  Subjective:       Debra Haynes is a 57 y.o. G52P2002 female here for a routine annual gynecologic exam. The patient {is/is not/has never been:13135} sexually active.The patient is taking hormone replacement therapy (has been using x ~ 4-5 years). Patient denies post-menopausal vaginal bleeding. The patient wears seatbelts: yes. The patient wears seatbelts: {yes/no:311178}. The patient participates in regular exercise: {yes/no/not asked:9010}. Has the patient ever been transfused or tattooed?: {yes/no/not asked:9010}. The patient reports that there {is/is not:9024} domestic violence in her life.  Current complaints: 1.  ***    Gynecologic History Patient's last menstrual period was 06/17/2017 (exact date). Contraception: post menopausal status Last Pap: 12/19/2019. Results were: normal Last mammogram: 02/12/2020. Results were: normal Last Colonoscopy: 01/01/2020 Repeat colonoscopy in 5 years due to family history.  Last Dexa Scan: 10/26/2016. Results were: osteopenia, T score -1.6.   Obstetric History OB History  Gravida Para Term Preterm AB Living  2 2 2     2  $ SAB IAB Ectopic Multiple Live Births          2    # Outcome Date GA Lbr Len/2nd Weight Sex Delivery Anes PTL Lv  2 Term 1997   8 lb 8 oz (3.856 kg) M Vag-Spont   LIV  1 Term 1994   7 lb 9.6 oz (3.447 kg) M Vag-Spont   LIV    Past Medical History:  Diagnosis Date   AR (allergic rhinitis)    Hypertension    TMJ (dislocation of temporomandibular joint)    Vitamin D deficiency     Family History  Problem Relation Age of Onset   Diabetes Father    Hypertension Father    Breast cancer Maternal Aunt    Colon cancer Maternal Grandfather    Hypertension Mother    Hyperthyroidism Mother        Ablation    Breast cancer Cousin 38       mat cousin   Ovarian cancer Neg Hx     Past Surgical History:  Procedure Laterality Date   COLONOSCOPY     COLONOSCOPY WITH  PROPOFOL N/A 01/01/2020   Procedure: COLONOSCOPY WITH PROPOFOL;  Surgeon: Lucilla Lame, MD;  Location: [facility] ENDOSCOPY;  Service: Endoscopy;  Laterality: N/A;    Social History   Socioeconomic History   Marital status: Married    Spouse name: Not on file   Number of children: 2   Years of education: Not on file   Highest education level: Not on file  Occupational History   Not on file  Tobacco Use   Smoking status: Never   Smokeless tobacco: Never  Vaping Use   Vaping Use: Never used  Substance and Sexual Activity   Alcohol use: Yes    Alcohol/week: 3.0 standard drinks of alcohol    Types: 3 Glasses of wine per week    Comment: 3 DAYS WEEK   Drug use: No   Sexual activity: Not Currently    Birth control/protection: None  Other Topics Concern   Not on file  Social History Narrative   Recently moved here from [place], [place] (2017)   Husband is an Forensic psychologist   2 Adult Children    Social Determinants of Health   Financial Resource Strain: Not on file  Food Insecurity: Not on file  Transportation Needs: Not on file  Physical Activity: Sufficiently Active (12/14/2018)   Exercise Vital Sign    Days of Exercise per Week:  4 days    Minutes of Exercise per Session: 60 min  Stress: Not on file  Social Connections: Not on file  Intimate Partner Violence: Not on file    Current Outpatient Medications on File Prior to Visit  Medication Sig Dispense Refill   fluticasone (FLONASE) 50 MCG/ACT nasal spray SHAKE LIQUID AND USE 1 SPRAY IN EACH NOSTRIL DAILY 16 g 2   hydrOXYzine (ATARAX) 25 MG tablet Take 1 tablet (25 mg total) by mouth every 6 (six) hours as needed for itching. 30 tablet 0   labetalol (NORMODYNE) 100 MG tablet TAKE 1 TABLET(100 MG) BY MOUTH DAILY 90 tablet 3   lidocaine (XYLOCAINE) 2 % solution Use as directed 15 mLs in the mouth or throat as needed for mouth pain. 100 mL 0   loratadine (CLARITIN) 10 MG tablet Take 10 mg by mouth daily.     Melatonin-Pyridoxine (MELATIN  PO) Take by mouth. As needed     Multiple Vitamins-Minerals (MULTIVITAMIN GUMMIES WOMENS PO) Take by mouth.     predniSONE (DELTASONE) 10 MG tablet 6 tabs on day 1-2, 5 tabs on day 3-4, 4 tabs on day 5-6, 3 tabs on day 7-8, 2 tabs day 9-10, 1 tab day 11-12 42 tablet 0   PREMPRO 0.625-5 MG tablet TAKE 1 TABLET BY MOUTH DAILY 84 tablet 3   No current facility-administered medications on file prior to visit.    Allergies  Allergen Reactions   Azithromycin    Yaz [Drospirenone-Ethinyl Estradiol] Rash      Review of Systems ROS Review of Systems - General ROS: negative for - chills, fatigue, fever, hot flashes, night sweats, weight gain or weight loss Psychological ROS: negative for - anxiety, decreased libido, depression, mood swings, physical abuse or sexual abuse Ophthalmic ROS: negative for - blurry vision, eye pain or loss of vision ENT ROS: negative for - headaches, hearing change, visual changes or vocal changes Allergy and Immunology ROS: negative for - hives, itchy/watery eyes or seasonal allergies Hematological and Lymphatic ROS: negative for - bleeding problems, bruising, swollen lymph nodes or weight loss Endocrine ROS: negative for - galactorrhea, hair pattern changes, hot flashes, malaise/lethargy, mood swings, palpitations, polydipsia/polyuria, skin changes, temperature intolerance or unexpected weight changes Breast ROS: negative for - new or changing breast lumps or nipple discharge Respiratory ROS: negative for - cough or shortness of breath Cardiovascular ROS: negative for - chest pain, irregular heartbeat, palpitations or shortness of breath Gastrointestinal ROS: no abdominal pain, change in bowel habits, or black or bloody stools Genito-Urinary ROS: no dysuria, trouble voiding, or hematuria Musculoskeletal ROS: negative for - joint pain or joint stiffness Neurological ROS: negative for - bowel and bladder control changes Dermatological ROS: negative for rash and skin  lesion changes   Objective:   LMP 06/17/2017 (Exact Date)  CONSTITUTIONAL: Well-developed, well-nourished female in no acute distress.  PSYCHIATRIC: Normal mood and affect. Normal behavior. Normal judgment and thought content. [place]: Alert and oriented to person, place, and time. Normal muscle tone coordination. No cranial nerve deficit noted. HENT:  Normocephalic, atraumatic, External right and left ear normal. Oropharynx is clear and moist EYES: Conjunctivae and EOM are normal. Pupils are equal, round, and reactive to light. No scleral icterus.  NECK: Normal range of motion, supple, no masses.  Normal thyroid.  SKIN: Skin is warm and dry. No rash noted. Not diaphoretic. No erythema. No pallor. CARDIOVASCULAR: Normal heart rate noted, regular rhythm, no murmur. RESPIRATORY: Clear to auscultation bilaterally. Effort and breath sounds normal, no  problems with respiration noted. BREASTS: Symmetric in size. No masses, skin changes, nipple drainage, or lymphadenopathy. ABDOMEN: Soft, normal bowel sounds, no distention noted.  No tenderness, rebound or guarding.  BLADDER: Normal PELVIC:  Bladder {:311640}  Urethra: {:311719}  Vulva: {:311722}  Vagina: {:311643}  Cervix: {:311644}  Uterus: {:311718}  Adnexa: {:311645}  RV: {Blank multiple:19196::"External Exam NormaI","No Rectal Masses","Normal Sphincter tone"}  MUSCULOSKELETAL: Normal range of motion. No tenderness.  No cyanosis, clubbing, or edema.  2+ distal pulses. LYMPHATIC: No Axillary, Supraclavicular, or Inguinal Adenopathy.   Labs: Lab Results  Component Value Date   WBC 4.7 12/23/2020   HGB 13.5 12/23/2020   HCT 40.9 12/23/2020   MCV 93 12/23/2020   PLT 300 12/23/2020    Lab Results  Component Value Date   CREATININE 0.74 12/23/2020   BUN 13 12/23/2020   NA 141 12/23/2020   K 4.2 12/23/2020   CL 103 12/23/2020   CO2 21 12/23/2020    Lab Results  Component Value Date   ALT 17 12/23/2020   AST 20 12/23/2020    ALKPHOS 62 12/23/2020   BILITOT 0.4 12/23/2020    Lab Results  Component Value Date   CHOL 224 (H) 12/23/2020   HDL 88 12/23/2020   LDLCALC 119 (H) 12/23/2020   TRIG 99 12/23/2020   CHOLHDL 2.5 12/23/2020    Lab Results  Component Value Date   TSH 1.630 12/23/2020    Lab Results  Component Value Date   HGBA1C 5.5 12/02/2017     Assessment:   1. Dyslipidemia   2. Encounter for well woman exam with routine gynecological exam   3. Breast cancer screening by mammogram      Plan:  Pap:  UTD Mammogram: Ordered Colon Screening:   UTD Labs:  Ordered Routine preventative health maintenance measures emphasized:  Self Breast Exams, Exercise/Diet/Weight control, and Stress Management COVID Vaccination status: Return to [place], [place]

## 2022-05-28 NOTE — Patient Instructions (Incomplete)
Preventive Care 40-57 Years Old, Female Preventive care refers to lifestyle choices and visits with your health care provider that can promote health and wellness. Preventive care visits are also called wellness exams. What can I expect for my preventive care visit? Counseling Your health care provider may ask you questions about your: Medical history, including: Past medical problems. Family medical history. Pregnancy history. Current health, including: Menstrual cycle. Method of birth control. Emotional well-being. Home life and relationship well-being. Sexual activity and sexual health. Lifestyle, including: Alcohol, nicotine or tobacco, and drug use. Access to firearms. Diet, exercise, and sleep habits. Work and work environment. Sunscreen use. Safety issues such as seatbelt and bike helmet use. Physical exam Your health care provider will check your: Height and weight. These may be used to calculate your BMI (body mass index). BMI is a measurement that tells if you are at a healthy weight. Waist circumference. This measures the distance around your waistline. This measurement also tells if you are at a healthy weight and may help predict your risk of certain diseases, such as type 2 diabetes and high blood pressure. Heart rate and blood pressure. Body temperature. Skin for abnormal spots. What immunizations do I need?  Vaccines are usually given at various ages, according to a schedule. Your health care provider will recommend vaccines for you based on your age, medical history, and lifestyle or other factors, such as travel or where you work. What tests do I need? Screening Your health care provider may recommend screening tests for certain conditions. This may include: Lipid and cholesterol levels. Diabetes screening. This is done by checking your blood sugar (glucose) after you have not eaten for a while (fasting). Pelvic exam and Pap test. Hepatitis B test. Hepatitis C  test. HIV (human immunodeficiency virus) test. STI (sexually transmitted infection) testing, if you are at risk. Lung cancer screening. Colorectal cancer screening. Mammogram. Talk with your health care provider about when you should start having regular mammograms. This may depend on whether you have a family history of breast cancer. BRCA-related cancer screening. This may be done if you have a family history of breast, ovarian, tubal, or peritoneal cancers. Bone density scan. This is done to screen for osteoporosis. Talk with your health care provider about your test results, treatment options, and if necessary, the need for more tests. Follow these instructions at home: Eating and drinking  Eat a diet that includes fresh fruits and vegetables, whole grains, lean protein, and low-fat dairy products. Take vitamin and mineral supplements as recommended by your health care provider. Do not drink alcohol if: Your health care provider tells you not to drink. You are pregnant, may be pregnant, or are planning to become pregnant. If you drink alcohol: Limit how much you have to 0-1 drink a day. Know how much alcohol is in your drink. In the [place] one drink equals one 12 oz bottle of beer (355 mL), one 5 oz glass of wine (148 mL), or one 1 oz glass of hard liquor (44 mL). Lifestyle Brush your teeth every morning and night with fluoride toothpaste. Floss one time each day. Exercise for at least 30 minutes 5 or more days each week. Do not use any products that contain nicotine or tobacco. These products include cigarettes, chewing tobacco, and vaping devices, such as e-cigarettes. If you need help quitting, ask your health care provider. Do not use drugs. If you are sexually active, practice safe sex. Use a condom or other form of protection to   prevent STIs. If you do not wish to become pregnant, use a form of birth control. If you plan to become pregnant, see your health care provider for a  prepregnancy visit. Take aspirin only as told by your health care provider. Make sure that you understand how much to take and what form to take. Work with your health care provider to find out whether it is safe and beneficial for you to take aspirin daily. Find healthy ways to manage stress, such as: Meditation, yoga, or listening to music. Journaling. Talking to a trusted person. Spending time with friends and family. Minimize exposure to UV radiation to reduce your risk of skin cancer. Safety Always wear your seat belt while driving or riding in a vehicle. Do not drive: If you have been drinking alcohol. Do not ride with someone who has been drinking. When you are tired or distracted. While texting. If you have been using any mind-altering substances or drugs. Wear a helmet and other protective equipment during sports activities. If you have firearms in your house, make sure you follow all gun safety procedures. Seek help if you have been physically or sexually abused. What's next? Visit your health care provider once a year for an annual wellness visit. Ask your health care provider how often you should have your eyes and teeth checked. Stay up to date on all vaccines. This information is not intended to replace advice given to you by your health care provider. Make sure you discuss any questions you have with your health care provider. Document Revised: 10/01/2020 Document Reviewed: 10/01/2020 Elsevier Patient Education  [place] Breast self-awareness is knowing how your breasts look and feel. You need to: Check your breasts on a regular basis. Tell your doctor about any changes. Become familiar with the look and feel of your breasts. This can help you catch a breast problem while it is still small and can be treated. You should do breast self-exams even if you have breast implants. What you need: A mirror. A well-lit room. A pillow or other  soft object. How to do a breast self-exam Follow these steps to do a breast self-exam: Look for changes  Take off all the clothes above your waist. Stand in front of a mirror in a room with good lighting. Put your hands down at your sides. Compare your breasts in the mirror. Look for any difference between them, such as: A difference in shape. A difference in size. Wrinkles, dips, and bumps in one breast and not the other. Look at each breast for changes in the skin, such as: Redness. Scaly areas. Skin that has gotten thicker. Dimpling. Open sores (ulcers). Look for changes in your nipples, such as: Fluid coming out of a nipple. Fluid around a nipple. Bleeding. Dimpling. Redness. A nipple that looks pushed in (retracted), or that has changed position. Feel for changes Lie on your back. Feel each breast. To do this: Pick a breast to feel. Place a pillow under the shoulder closest to that breast. Put the arm closest to that breast behind your head. Feel the nipple area of that breast using the hand of your other arm. Feel the area with the pads of your three middle fingers by making small circles with your fingers. Use light, medium, and firm pressure. Continue the overlapping circles, moving downward over the breast. Keep making circles with your fingers. Stop when you feel your ribs. Start making circles with your fingers again, this time going  upward until you reach your collarbone. Then, make circles outward across your breast and into your armpit area. Squeeze your nipple. Check for discharge and lumps. Repeat these steps to check your other breast. Sit or stand in the tub or shower. With soapy water on your skin, feel each breast the same way you did when you were lying down. Write down what you find Writing down what you find can help you remember what to tell your doctor. Write down: What is normal for each breast. Any changes you find in each breast. These  include: The kind of changes you find. A tender or painful breast. Any lump you find. Write down its size and where it is. When you last had your monthly period (menstrual cycle). General tips If you are breastfeeding, the best time to check your breasts is after you feed your baby or after you use a breast pump. If you get monthly bleeding, the best time to check your breasts is 5-7 days after your monthly cycle ends. With time, you will become comfortable with the self-exam. You will also start to know if there are changes in your breasts. Contact a doctor if: You see a change in the shape or size of your breasts or nipples. You see a change in the skin of your breast or nipples, such as red or scaly skin. You have fluid coming from your nipples that is not normal. You find a new lump or thick area. You have breast pain. You have any concerns about your breast health. Summary Breast self-awareness includes looking for changes in your breasts and feeling for changes within your breasts. You should do breast self-awareness in front of a mirror in a well-lit room. If you get monthly periods (menstrual cycles), the best time to check your breasts is 5-7 days after your period ends. Tell your doctor about any changes you see in your breasts. Changes include changes in size, changes on the skin, painful or tender breasts, or fluid from your nipples that is not normal. This information is not intended to replace advice given to you by your health care provider. Make sure you discuss any questions you have with your health care provider. Document Revised: 09/10/2021 Document Reviewed: 02/05/2021 Elsevier Patient Education  [place].

## 2022-06-01 ENCOUNTER — Ambulatory Visit (INDEPENDENT_AMBULATORY_CARE_PROVIDER_SITE_OTHER): Payer: BC Managed Care – PPO | Admitting: Obstetrics and Gynecology

## 2022-06-01 ENCOUNTER — Encounter: Payer: Self-pay | Admitting: Obstetrics and Gynecology

## 2022-06-01 VITALS — BP 138/80 | HR 69 | Resp 16 | Ht <= 58 in | Wt 142.8 lb

## 2022-06-01 DIAGNOSIS — Z8639 Personal history of other endocrine, nutritional and metabolic disease: Secondary | ICD-10-CM | POA: Diagnosis not present

## 2022-06-01 DIAGNOSIS — E785 Hyperlipidemia, unspecified: Secondary | ICD-10-CM

## 2022-06-01 DIAGNOSIS — Z1231 Encounter for screening mammogram for malignant neoplasm of breast: Secondary | ICD-10-CM

## 2022-06-01 DIAGNOSIS — Z01419 Encounter for gynecological examination (general) (routine) without abnormal findings: Secondary | ICD-10-CM | POA: Diagnosis not present

## 2022-06-01 DIAGNOSIS — M85851 Other specified disorders of bone density and structure, right thigh: Secondary | ICD-10-CM

## 2022-06-01 DIAGNOSIS — Z79890 Hormone replacement therapy (postmenopausal): Secondary | ICD-10-CM

## 2022-06-02 LAB — COMPREHENSIVE METABOLIC PANEL
ALT: 20 IU/L (ref 0–32)
AST: 20 IU/L (ref 0–40)
Albumin/Globulin Ratio: 2.1 (ref 1.2–2.2)
Albumin: 4.8 g/dL (ref 3.8–4.9)
Alkaline Phosphatase: 72 IU/L (ref 44–121)
BUN/Creatinine Ratio: 23 (ref 9–23)
BUN: 14 mg/dL (ref 6–24)
Bilirubin Total: <0.2 mg/dL (ref 0.0–1.2)
CO2: 20 mmol/L (ref 20–29)
Calcium: 9.9 mg/dL (ref 8.7–10.2)
Chloride: 103 mmol/L (ref 96–106)
Creatinine, Ser: 0.6 mg/dL (ref 0.57–1.00)
Globulin, Total: 2.3 g/dL (ref 1.5–4.5)
Glucose: 85 mg/dL (ref 70–99)
Potassium: 4.1 mmol/L (ref 3.5–5.2)
Sodium: 140 mmol/L (ref 134–144)
Total Protein: 7.1 g/dL (ref 6.0–8.5)
eGFR: 105 mL/min/{1.73_m2} (ref >59)

## 2022-06-02 LAB — LIPID PANEL
Chol/HDL Ratio: 2.1 ratio (ref 0.0–4.4)
Cholesterol, Total: 228 mg/dL — ABNORMAL HIGH (ref 100–199)
HDL: 109 mg/dL (ref >39)
LDL Chol Calc (NIH): 104 mg/dL — ABNORMAL HIGH (ref 0–99)
Triglycerides: 89 mg/dL (ref 0–149)
VLDL Cholesterol Cal: 15 mg/dL (ref 5–40)

## 2022-06-02 LAB — CBC
Hematocrit: 40 % (ref 34.0–46.6)
Hemoglobin: 13.4 g/dL (ref 11.1–15.9)
MCH: 30.2 pg (ref 26.6–33.0)
MCHC: 33.5 g/dL (ref 31.5–35.7)
MCV: 90 fL (ref 79–97)
Platelets: 330 10*3/uL (ref 150–450)
RBC: 4.44 x10E6/uL (ref 3.77–5.28)
RDW: 12.1 % (ref 11.7–15.4)
WBC: 5.8 10*3/uL (ref 3.4–10.8)

## 2022-06-02 LAB — TSH: TSH: 1.66 u[IU]/mL (ref 0.450–4.500)

## 2022-06-02 LAB — VITAMIN D 25 HYDROXY (VIT D DEFICIENCY, FRACTURES): Vit D, 25-Hydroxy: 40.8 ng/mL (ref 30.0–100.0)

## 2022-06-02 LAB — HEMOGLOBIN A1C
Est. average glucose Bld gHb Est-mCnc: 108 mg/dL
Hgb A1c MFr Bld: 5.4 % (ref 4.8–5.6)

## 2022-06-08 ENCOUNTER — Ambulatory Visit
Admission: RE | Admit: 2022-06-08 | Discharge: 2022-06-08 | Disposition: A | Discharge status: Home / Self Care | Payer: BC Managed Care – PPO | Source: Ambulatory Visit | Attending: Obstetrics and Gynecology | Admitting: Obstetrics and Gynecology

## 2022-06-08 DIAGNOSIS — Z1231 Encounter for screening mammogram for malignant neoplasm of breast: Secondary | ICD-10-CM | POA: Insufficient documentation

## 2022-08-16 ENCOUNTER — Other Ambulatory Visit: Payer: Self-pay | Admitting: Obstetrics and Gynecology

## 2023-03-23 ENCOUNTER — Ambulatory Visit: Payer: BC Managed Care – PPO

## 2023-04-07 ENCOUNTER — Other Ambulatory Visit: Payer: Self-pay | Admitting: Obstetrics and Gynecology

## 2023-07-16 DIAGNOSIS — S93491A Sprain of other ligament of right ankle, initial encounter: Secondary | ICD-10-CM | POA: Diagnosis not present

## 2023-07-16 DIAGNOSIS — I10 Essential (primary) hypertension: Secondary | ICD-10-CM | POA: Diagnosis not present

## 2023-07-16 DIAGNOSIS — M25571 Pain in right ankle and joints of right foot: Secondary | ICD-10-CM | POA: Diagnosis not present

## 2024-03-28 DIAGNOSIS — R9230 Dense breasts, unspecified: Secondary | ICD-10-CM | POA: Diagnosis not present
